# Patient Record
Sex: Male | Born: 1950 | Race: Black or African American | Hispanic: No | Marital: Married | Smoking: Never smoker
Health system: Southern US, Community
[De-identification: ages and names within clinical notes are randomized; demographics above are authoritative.]

## PROBLEM LIST (undated history)

## (undated) DIAGNOSIS — I6529 Occlusion and stenosis of unspecified carotid artery: Secondary | ICD-10-CM

## (undated) DIAGNOSIS — I10 Essential (primary) hypertension: Secondary | ICD-10-CM

## (undated) DIAGNOSIS — E785 Hyperlipidemia, unspecified: Secondary | ICD-10-CM

## (undated) HISTORY — DX: Essential (primary) hypertension: I10

## (undated) HISTORY — DX: Occlusion and stenosis of unspecified carotid artery: I65.29

## (undated) HISTORY — DX: Hyperlipidemia, unspecified: E78.5

---

## 2000-03-19 ENCOUNTER — Ambulatory Visit (HOSPITAL_COMMUNITY): Admission: RE | Admit: 2000-03-19 | Discharge: 2000-03-19 | Payer: Self-pay | Admitting: Cardiology

## 2001-05-21 ENCOUNTER — Encounter: Payer: Self-pay | Admitting: Cardiology

## 2001-05-21 ENCOUNTER — Inpatient Hospital Stay (HOSPITAL_COMMUNITY): Admission: EM | Admit: 2001-05-21 | Discharge: 2001-05-26 | Payer: Self-pay | Admitting: Emergency Medicine

## 2002-05-28 ENCOUNTER — Ambulatory Visit (HOSPITAL_COMMUNITY): Admission: RE | Admit: 2002-05-28 | Discharge: 2002-05-28 | Payer: Self-pay | Admitting: Cardiology

## 2004-07-04 ENCOUNTER — Ambulatory Visit (HOSPITAL_COMMUNITY): Admission: RE | Admit: 2004-07-04 | Discharge: 2004-07-04 | Payer: Self-pay | Admitting: Cardiology

## 2006-11-14 ENCOUNTER — Ambulatory Visit: Payer: Self-pay | Admitting: Vascular Surgery

## 2006-11-14 ENCOUNTER — Encounter: Payer: Self-pay | Admitting: Vascular Surgery

## 2006-11-14 ENCOUNTER — Ambulatory Visit (HOSPITAL_COMMUNITY): Admission: RE | Admit: 2006-11-14 | Discharge: 2006-11-14 | Payer: Self-pay | Admitting: Cardiology

## 2007-07-08 ENCOUNTER — Ambulatory Visit: Payer: Self-pay | Admitting: Vascular Surgery

## 2008-07-20 ENCOUNTER — Ambulatory Visit: Payer: Self-pay | Admitting: Vascular Surgery

## 2008-07-29 ENCOUNTER — Ambulatory Visit: Payer: Self-pay | Admitting: Vascular Surgery

## 2009-07-26 ENCOUNTER — Ambulatory Visit: Payer: Self-pay | Admitting: Vascular Surgery

## 2010-08-09 ENCOUNTER — Ambulatory Visit: Payer: Self-pay | Admitting: Vascular Surgery

## 2011-02-12 NOTE — Procedures (Signed)
CAROTID DUPLEX EXAM   INDICATION:  Follow up carotid disease.   HISTORY:  Diabetes:  No.  Cardiac:  No.  Hypertension:  Yes.  Smoking:  No.  Previous Surgery:  No.  CV History:  Currently asymptomatic.  Amaurosis Fugax No, Paresthesias No, Hemiparesis No.                                       RIGHT             LEFT  Brachial systolic pressure:         132               136  Brachial Doppler waveforms:         Normal            Normal  Vertebral direction of flow:        Antegrade         Antegrade  DUPLEX VELOCITIES (cm/sec)  CCA peak systolic                   100               105  ECA peak systolic                   193               222  ICA peak systolic                   143               145  ICA end diastolic                   32                37  PLAQUE MORPHOLOGY:                  Mixed             Mixed  PLAQUE AMOUNT:                      Moderate          Moderate  PLAQUE LOCATION:                    ICA/bifurcation   ICA/ECA   IMPRESSION:  1. Doppler velocities suggest 40% to 59% stenoses of the bilateral      proximal internal carotid arteries.  2. Elevated velocities noted in the bilateral external carotid      arteries.  3. No significant change noted when compared to the previous      examination on 07/26/2009.   ___________________________________________  Janetta Hora. Fields, MD   CH/MEDQ  D:  08/09/2010  T:  08/09/2010  Job:  [id number]

## 2011-02-12 NOTE — Assessment & Plan Note (Signed)
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  His last time seen by a physician at  this practice was 2006.  He has undergone yearly serial duplex Dopplers  of his carotid arteries.   Past medical history is significant for hypertension and dyslipidemia,  which are currently controlled by Dr. Shana Chute.   SOCIAL HISTORY:  He is married with 3 children.  He is a Psychologist, counselling.  He has no history of alcohol or tobacco use.   Review of systems was entirely negative.   PHYSICAL FINDINGS:  Height 5 feet 9.  Weight was 202 pounds.  Heart rate  was 75, blood pressure was 138/84, O2 sat was 98%.  On physical finding,  he was well-nourished in no distress.  HEENT:  PERRLA, EOMI with normal  conjunctiva.  Mucous membranes were pink and moist.  I did not  appreciate carotid bruits.  Lungs were clear to auscultation.  Cardiac  exam revealed a regular rate and rhythm.  The abdomen was soft,  nontender, nondistended.  Musculoskeletal exam demonstrated no major  deformities.  Neurological exam was nonfocal.   LABORATORY RESULTS:  He underwent bilateral duplex Dopplers.  He has a  stable 40% to 59% narrowing of his bilateral internal carotid arteries.  Elevated velocities were noted in the bilateral ECA's.  Vertebral flow  was antegrade.   ASSESSMENT/PLAN:  This 60 year old gentleman with carotid occlusive  disease remains stable at this time.  We will continue to follow him for  worsening of his carotid occlusive disease.  He will return to see [place] in  1 year with follow-up of bilateral carotid Dopplers.   Roger Arms, PA   Roger Hora. Fields, MD  Electronically Signed   KEL/MEDQ  D:  08/09/2010  T:   08/09/2010  Job:  [id number]

## 2011-02-12 NOTE — Procedures (Signed)
CAROTID DUPLEX EXAM   INDICATION:  Carotid disease.   HISTORY:  Diabetes:  No.  Cardiac:  No.  Hypertension:  Yes.  Smoking:  No.  Previous Surgery:  No.  CV History:  Asymptomatic.  Amaurosis Fugax No, Paresthesias No, Hemiparesis No.                                       RIGHT             LEFT  Brachial systolic pressure:         146               142  Brachial Doppler waveforms:         Normal            Normal  Vertebral direction of flow:        Antegrade         Antegrade  DUPLEX VELOCITIES (cm/sec)  CCA peak systolic                   115               105  ECA peak systolic                   185               245  ICA peak systolic                   124               136  ICA end diastolic                   30                33  PLAQUE MORPHOLOGY:                  Mixed             Mixed  PLAQUE AMOUNT:                      Mild              Mild  PLAQUE LOCATION:                    ICA/ECA           ICA/ECA   IMPRESSION:  1. Patient with 40- 59% stenosis of the bilateral internal carotid      arteries.  2. No significant change noted when compared to the previous exam on      July 20, 2009.    ___________________________________________  Janetta Hora. Fields, MD   CH/MEDQ  D:  07/26/2009  T:  07/26/2009  Job:  [id number]

## 2011-02-12 NOTE — Procedures (Signed)
CAROTID DUPLEX EXAM   INDICATION:  Followup of known carotid artery disease.  The patient is  asymptomatic.   HISTORY:  Diabetes:  No.  Cardiac:  No.  Hypertension:  Yes.  Smoking:  No.  Previous Surgery:  No.  CV History:  Amaurosis Fugax No, Paresthesias No, Hemiparesis No.                                       RIGHT             LEFT  Brachial systolic pressure:         164.              158.  Brachial Doppler waveforms:         Within normal limit.                Within normal limit.  Vertebral direction of flow:        Antegrade.        Antegrade.  DUPLEX VELOCITIES (cm/sec)  CCA peak systolic                   96.               92.  ECA peak systolic                   147.              253.  ICA peak systolic                   99.               175.  ICA end diastolic                   33.               53.  PLAQUE MORPHOLOGY:                  Heterogenous, calcified.            Heterogenous, calcified.  PLAQUE AMOUNT:                      Mild.             Moderate.  PLAQUE LOCATION:                    Bifurcation, ICA. Bifurcation, ICA.   IMPRESSION:  1. Essentially unchanged carotid duplex exam bilaterally.  2. 1-39% right internal carotid artery stenosis according to new      velocity criteria.  3. 40-59% left internal carotid artery stenosis according to new      velocity criteria.  4. Left external carotid artery stenosis.   ___________________________________________  Janetta Hora Fields, MD   PB/MEDQ  D:  07/08/2007  T:  07/09/2007  Job:  [id number]

## 2011-02-12 NOTE — Procedures (Signed)
CAROTID DUPLEX EXAM   INDICATION:  Follow up carotid artery disease.   HISTORY:  Diabetes:  No.  Cardiac:  No.  Hypertension:  Yes.  Smoking:  No.  Previous Surgery:  No.  CV History:  No.  Amaurosis Fugax No, Paresthesias No, Hemiparesis No.                                       RIGHT               LEFT  Brachial systolic pressure:         135                 130  Brachial Doppler waveforms:         WNL                 WNL  Vertebral direction of flow:        Antegrade           Antegrade  DUPLEX VELOCITIES (cm/sec)  CCA peak systolic                   79                  99  ECA peak systolic                   239                 351  ICA peak systolic                   128                 167  ICA end diastolic                   30                  51  PLAQUE MORPHOLOGY:                  Mixed               Mixed  PLAQUE AMOUNT:                      Mild                Moderate  PLAQUE LOCATION:                    ICA/ECA/bifurcation  ICA/ECA/bifurcation   IMPRESSION:  1. Bilateral internal carotid arteries show evidence of 40-59%      stenosis.  2. Bilateral external carotid artery stenosis.  3. No significant changes in velocities from previous study.  4. Incidental note:  Nodules noted in bilateral sides of the thyroid,      as previously documented, with right measuring 1.13 cm X 1.59 cm      and left measuring 0.69 cm X 0.98 cm.   ___________________________________________  Janetta Hora. Fields, MD   AS/MEDQ  D:  07/20/2008  T:  07/20/2008  Job:  [id number]

## 2011-02-15 NOTE — Discharge Summary (Signed)
[facility]  Patient:    Roger [name], Roger [name] Visit Number: [id number] MRN: [id number]          Service Type: MED Location: [phone] Attending Physician:  Sharyn Dross Dictated by:   Sharyn Dross., M.D. Adm. Date:  05/21/2001 Disc. Date: 05/26/2001                             Discharge Summary  ADMISSION DIAGNOSIS:  Lower gastrointestinal bleeding, etiology unknown.  DISCHARGE DIAGNOSIS:  Gastrointestinal bleeding, probably secondary to acid peptic disease.  CONDITION ON DISCHARGE:  Stable and improved.  DISCHARGE MEDICATIONS: 1. Niferex 150 mg q.d. 2. Protonix 40 mg q.d.  COMPLICATIONS:  None.  CONSULTATIONS:  None.  FOLLOWUP:  The patient is to follow up with me in one week for a re-evaluation in the office.  PROCEDURES: 1. On 05/22/01, esophagogastroduodenoscopy.  The results showed evidence of acute gastritis that is present. 2. On 05/23/01, colonoscopy.  Results were grossly normal examination.  HOSPITAL COURSE:  The patient was admitted into the hospital because of persistent lower gastrointestinal bleeding which started the morning of the admission.  The patient states that he passed initially bright blood, but became melanic in nature.  Based on the character of his bleeding, the patient was admitted into the hospital at this time.  The patient was started with IV therapy, and his history was very suggestive more for an upper GI bleed due to the character of melanotic stools, than a lower GI bleed that was noted.  He initially underwent an upper GI endoscopy to evaluate the area.  The results of the endoscopy showed evidence of acute gastritis with evidence of small remnants of blood that was present.  There was no active bleeding that was noted.  He was subsequently started on a proton pump inhibitor to help to control this.  However, it was unsure if evidence of blood from the lower GI tract was ongoing also.  Thus, the  patient underwent a colonoscopic examination.  The results of the colonoscopy were normal on the patient at this time.  The patients hospital course has been subsequently uneventful.  He dropped his initial hemoglobin from 12 down to 9, and then 8.8.  He was subsequently noted to have increased dizziness and weakness when the patient arrived to his bed.  He was transferred to the intensive care unit for close monitoring before the endoscopic procedures were performed.  After the results of the colonoscopic examination, the patients vital signs were relatively stable, and he was subsequently transferred out of the unit.  He had received 1 unit of packed red blood cells, and his repeat CBC was greater than 10, and thus the second unit was not given at that time.  The patient had no major symptoms of recurrence of bleeding, and in watching his hemoglobins over the past few days, it went from 12.7 down to 9.9, and then to 8.8, that was present when he received his transfusions, and subsequently had remained in the 8 range at this time.  It gradually dropped down to 8.7 and then subsequently to 8.4, but on today it was back to 8.8 at this time.  His Hemoccults were negative during the rest of the hospital course at this time with no major changes at this time.  There was no evidence of any orthostasis that was noted, and his blood pressure has been relatively stable at this time.  The patient will be discharged to home today, and he will be given Niferex to take on a daily basis for iron replacement, as well as Protonix 40 mg for the acute gastritis symptoms that he has.  He will be followed up in the office next week, and if stable, he will be able to return back to work without any complications at that point.  He may need to have a repeat endoscopic evaluation to determine that the healing process of the gastritis has improved, and a biopsy for Helicobacter should be done also to rule out  any possible evidence of infection or recurrent infections, bacterial response noted.  FINAL DIAGNOSIS:  As above.  CLINICAL CONDITION:  Stable and improved.  The patient was advised not to take aspirin products at least for the next month, and then if he resumes his aspirin a day, to use a baby aspirin coated at that time.Dictated by:   Sharyn Dross., M.D. Attending Physician:  Sharyn Dross DD:  05/26/01 TD:  05/26/01 Job: [id number]

## 2011-08-08 ENCOUNTER — Other Ambulatory Visit (INDEPENDENT_AMBULATORY_CARE_PROVIDER_SITE_OTHER): Payer: No Typology Code available for payment source | Admitting: *Deleted

## 2011-08-08 DIAGNOSIS — I6529 Occlusion and stenosis of unspecified carotid artery: Secondary | ICD-10-CM

## 2011-08-16 ENCOUNTER — Other Ambulatory Visit: Payer: Self-pay | Admitting: Vascular Surgery

## 2011-08-16 ENCOUNTER — Encounter: Payer: Self-pay | Admitting: Vascular Surgery

## 2011-08-16 DIAGNOSIS — I6529 Occlusion and stenosis of unspecified carotid artery: Secondary | ICD-10-CM

## 2011-08-16 NOTE — Procedures (Unsigned)
CAROTID DUPLEX EXAM  INDICATION:  Follow up carotid disease.  HISTORY: Diabetes:  No. Cardiac:  No. Hypertension:  Yes. Smoking:  No. Previous Surgery:  No. CV History:  Asymptomatic. Amaurosis Fugax No, Paresthesias No, Hemiparesis No.                                      RIGHT             LEFT Brachial systolic pressure:         133               135 Brachial Doppler waveforms:         Normal            Normal Vertebral direction of flow:        Antegrade         Antegrade DUPLEX VELOCITIES (cm/sec) CCA peak systolic                   131               92 ECA peak systolic                   187               294 ICA peak systolic                   135               75 ICA end diastolic                   40                23 PLAQUE MORPHOLOGY:                  Mixed             Mixed PLAQUE AMOUNT:                      Moderate          Moderate PLAQUE LOCATION:                    ICA, bifurcation  ICA, ECA  IMPRESSION: 1. Right internal carotid artery velocities suggest a 40% to 59%     stenosis. 2. Left internal carotid artery velocities suggest a 1% to 39%     stenosis.  Velocities may be underestimated due to calcific plaque     with acoustic shadowing. 3. Bilateral external carotid artery stenosis.  ___________________________________________ Janetta Hora Fields, MD  EM/MEDQ  D:  08/08/2011  T:  08/08/2011  Job:  [id number]

## 2011-10-16 ENCOUNTER — Ambulatory Visit (INDEPENDENT_AMBULATORY_CARE_PROVIDER_SITE_OTHER): Payer: No Typology Code available for payment source

## 2011-10-16 DIAGNOSIS — B9789 Other viral agents as the cause of diseases classified elsewhere: Secondary | ICD-10-CM

## 2012-05-05 DIAGNOSIS — L989 Disorder of the skin and subcutaneous tissue, unspecified: Secondary | ICD-10-CM | POA: Insufficient documentation

## 2012-07-31 ENCOUNTER — Encounter: Payer: Self-pay | Admitting: *Deleted

## 2012-08-12 ENCOUNTER — Encounter: Payer: Self-pay | Admitting: Neurosurgery

## 2012-08-13 ENCOUNTER — Other Ambulatory Visit (INDEPENDENT_AMBULATORY_CARE_PROVIDER_SITE_OTHER): Payer: No Typology Code available for payment source | Admitting: *Deleted

## 2012-08-13 ENCOUNTER — Ambulatory Visit (INDEPENDENT_AMBULATORY_CARE_PROVIDER_SITE_OTHER): Payer: No Typology Code available for payment source | Admitting: Neurosurgery

## 2012-08-13 ENCOUNTER — Encounter: Payer: Self-pay | Admitting: Neurosurgery

## 2012-08-13 VITALS — BP 133/74 | HR 74 | Resp 16 | Ht 69.0 in | Wt 204.0 lb

## 2012-08-13 DIAGNOSIS — I6529 Occlusion and stenosis of unspecified carotid artery: Secondary | ICD-10-CM

## 2012-08-13 NOTE — Progress Notes (Signed)
[facility] Carotid Office Note  CC: Carotid surveillance Referring Physician: Fields  History of Present Illness: 61 year old male patient of Dr. Darrick Penna without any history of carotid intervention. The patient denies any signs or symptoms of CVA, TIA, amaurosis fugax or any neural deficit. The patient denies any new medical diagnoses recent surgery.  Past Medical History  Diagnosis Date  . Carotid artery occlusion   . Hyperlipidemia   . Hypertension     ROS: [x]  Positive   [ ]  Denies    General: [ ]  Weight loss, [ ]  Fever, [ ]  chills Neurologic: [ ]  Dizziness, [ ]  Blackouts, [ ]  Seizure [ ]  Stroke, [ ]  "Mini stroke", [ ]  Slurred speech, [ ]  Temporary blindness; [ ]  weakness in arms or legs, [ ]  Hoarseness Cardiac: [ ]  Chest pain/pressure, [ ]  Shortness of breath at rest [ ]  Shortness of breath with exertion, [ ]  Atrial fibrillation or irregular heartbeat Vascular: [ ]  Pain in legs with walking, [ ]  Pain in legs at rest, [ ]  Pain in legs at night,  [ ]  Non-healing ulcer, [ ]  Blood clot in vein/DVT,   Pulmonary: [ ]  Home oxygen, [ ]  Productive cough, [ ]  Coughing up blood, [ ]  Asthma,  [ ]  Wheezing Musculoskeletal:  [ ]  Arthritis, [ ]  Low back pain, [ ]  Joint pain Hematologic: [ ]  Easy Bruising, [ ]  Anemia; [ ]  Hepatitis Gastrointestinal: [ ]  Blood in stool, [ ]  Gastroesophageal Reflux/heartburn, [ ]  Trouble swallowing Urinary: [ ]  chronic Kidney disease, [ ]  on HD - [ ]  MWF or [ ]  TTHS, [ ]  Burning with urination, [ ]  Difficulty urinating Skin: [ ]  Rashes, [ ]  Wounds Psychological: [ ]  Anxiety, [ ]  Depression   Social History History  Substance Use Topics  . Smoking status: Never Smoker   . Smokeless tobacco: Never Used  . Alcohol Use: No    Family History Family History  Problem Relation Age of Onset  . Stroke Mother   . Hypertension Mother   . Stroke Father   . Diabetes Sister   . Hypertension Sister   . Hypertension Daughter     No  Known Allergies  Current Outpatient Prescriptions  Medication Sig Dispense Refill  . amlodipine-atorvastatin (CADUET) 10-20 MG per tablet       . aspirin 81 MG tablet Take 81 mg by mouth daily.      . clopidogrel (PLAVIX) 75 MG tablet       . losartan (COZAAR) 100 MG tablet       . Multiple Vitamin (MULTIVITAMIN) tablet Take 1 tablet by mouth daily.      . vitamin E (VITAMIN E) 400 UNIT capsule Take 400 Units by mouth daily.        Physical Examination  Filed Vitals:   08/13/12 1435  BP: 133/74  Pulse: 74  Resp:     Body mass index is 30.13 kg/(m^2).  General:  WDWN in NAD Gait: Normal HEENT: WNL Eyes: Pupils equal Pulmonary: normal non-labored breathing , without Rales, rhonchi,  wheezing Cardiac: RRR, without  Murmurs, rubs or gallops; Abdomen: soft, NT, no masses Skin: no rashes, ulcers noted  Vascular Exam Pulses: 3+ radial pulses bilaterally Carotid bruits: Carotid pulses to auscultation no bruits are heard Extremities without ischemic changes, no Gangrene , no cellulitis; no open wounds;  Musculoskeletal: no muscle wasting or atrophy   Neurologic: A&O X 3; Appropriate Affect ; SENSATION: normal; MOTOR FUNCTION:  moving all extremities  equally. Speech is fluent/normal  Non-Invasive Vascular Imaging CAROTID DUPLEX 08/13/2012  Right ICA 20 - 39 % stenosis Left ICA 40 - 59 % stenosis   ASSESSMENT/PLAN: Asymptomatic patient with a slight increase in left ICA stenosis over the last year. The patient will followup in one year with repeat carotid duplex. The patient's questions were encouraged and answered, he is in agreement with this plan.  Lauree Chandler ANP   Clinic MD: Darrick Penna

## 2012-08-14 ENCOUNTER — Other Ambulatory Visit: Payer: Self-pay | Admitting: *Deleted

## 2012-08-14 NOTE — Addendum Note (Signed)
Addended by: Sharee Pimple on: 08/14/2012 08:47 AM   Modules accepted: Orders

## 2013-08-17 ENCOUNTER — Encounter: Payer: Self-pay | Admitting: Family

## 2013-08-18 ENCOUNTER — Ambulatory Visit: Payer: No Typology Code available for payment source | Admitting: Family

## 2013-08-18 ENCOUNTER — Inpatient Hospital Stay (HOSPITAL_COMMUNITY): Admission: RE | Admit: 2013-08-18 | Payer: No Typology Code available for payment source | Source: Ambulatory Visit

## 2013-08-19 ENCOUNTER — Ambulatory Visit: Payer: No Typology Code available for payment source | Admitting: Neurosurgery

## 2013-08-19 ENCOUNTER — Other Ambulatory Visit: Payer: No Typology Code available for payment source

## 2013-09-16 ENCOUNTER — Encounter: Payer: Self-pay | Admitting: Family

## 2013-09-17 ENCOUNTER — Ambulatory Visit (HOSPITAL_COMMUNITY)
Admission: RE | Admit: 2013-09-17 | Discharge: 2013-09-17 | Disposition: A | Discharge status: Home / Self Care | Payer: No Typology Code available for payment source | Source: Ambulatory Visit | Attending: Family | Admitting: Family

## 2013-09-17 ENCOUNTER — Encounter: Payer: Self-pay | Admitting: Family

## 2013-09-17 ENCOUNTER — Ambulatory Visit (INDEPENDENT_AMBULATORY_CARE_PROVIDER_SITE_OTHER): Payer: No Typology Code available for payment source | Admitting: Family

## 2013-09-17 DIAGNOSIS — R52 Pain, unspecified: Secondary | ICD-10-CM | POA: Insufficient documentation

## 2013-09-17 DIAGNOSIS — I6529 Occlusion and stenosis of unspecified carotid artery: Secondary | ICD-10-CM | POA: Insufficient documentation

## 2013-09-17 DIAGNOSIS — I658 Occlusion and stenosis of other precerebral arteries: Secondary | ICD-10-CM | POA: Insufficient documentation

## 2013-09-17 DIAGNOSIS — M79609 Pain in unspecified limb: Secondary | ICD-10-CM

## 2013-09-17 NOTE — Progress Notes (Signed)
Established Carotid Patient  Previous Carotid surgery: No  History of Present Illness  Roger [name] is a 62 y.o. male patient of Dr. Darrick Penna who has known carotid stenosis, returns today for follow up.  Patient has Negative history of TIA or stroke symptom.  The patient denies amaurosis fugax or monocular blindness.  The patient  denies facial drooping.  Pt. denies hemiplegia.  The patient denies receptive or expressive aphasia.  Pt. denies extremity weakness. Denies claudication symptoms, denies non-healing wounds.   Patient denies New Medical or Surgical History.  Pt Diabetic: No Pt smoker: non-smoker  Pt meds include: Statin : Yes ASA: Yes Other anticoagulants/antiplatelets: Plavix   Past Medical History  Diagnosis Date  . Carotid artery occlusion   . Hyperlipidemia   . Hypertension     Social History History  Substance Use Topics  . Smoking status: Never Smoker   . Smokeless tobacco: Never Used  . Alcohol Use: No    Family History Family History  Problem Relation Age of Onset  . Stroke Mother   . Hypertension Mother   . Stroke Father   . Diabetes Sister   . Hypertension Sister   . Hypertension Daughter     Surgical History History reviewed. No pertinent past surgical history.  No Known Allergies  Current Outpatient Prescriptions  Medication Sig Dispense Refill  . amlodipine-atorvastatin (CADUET) 10-20 MG per tablet       . aspirin 81 MG tablet Take 81 mg by mouth daily.      . clopidogrel (PLAVIX) 75 MG tablet       . losartan (COZAAR) 100 MG tablet       . Multiple Vitamin (MULTIVITAMIN) tablet Take 1 tablet by mouth daily.      . vitamin E (VITAMIN E) 400 UNIT capsule Take 400 Units by mouth daily.       No current facility-administered medications for this visit.    Review of Systems : See HPI for pertinent positives and negatives.  Physical Examination  Filed Vitals:   09/17/13 1159  BP: 137/77  Pulse: 65  Resp: 16   Filed Weights   09/17/13 1159  Weight: 198 lb (89.812 kg)   Body mass index is 29.23 kg/(m^2).   General: WDWN male in NAD GAIT: normal Eyes: PERRLA Pulmonary:  CTAB, Negative  Rales, Negative rhonchi, & Negative wheezing.  Cardiac: regular Rhythm ,  Negative Murmurs.  VASCULAR EXAM Carotid Bruits Left Right   Negative Negative    Aorta is not palpable. Radial pulses are 3+ palpable and equal.                                                                                                                            LE Pulses LEFT RIGHT       POPLITEAL  not palpable   not palpable       POSTERIOR TIBIAL   palpable    palpable  DORSALIS PEDIS      ANTERIOR TIBIAL  palpable   palpable     Gastrointestinal: soft, nontender, BS WNL, no r/g,  negative masses.  Musculoskeletal: Negative muscle atrophy/wasting. M/S 5/5 throughout, Extremities without ischemic changes.  Neurologic: A&O X 3; Appropriate Affect ; SENSATION ;normal;  Speech is normal CN 2-12 intact, Pain and light touch intact in extremities, Motor exam as listed above.   Non-Invasive Vascular Imaging CAROTID DUPLEX 09/17/2013   Right ICA: <40% stenosis. Left ICA: 40 - 59 % stenosis.  These findings are Unchanged from previous exam.  Assessment: Roger [name] is a 62 y.o. male who presents with asymptomatic minimal right ICA stenosis and mild/moderate left ICA stenosis. The  ICA stenosis is  Unchanged from previous exam.  Plan: Follow-up in 1 year with Carotid Duplex scan.   I discussed in depth with the patient the nature of atherosclerosis, and emphasized the importance of maximal medical management including strict control of blood pressure, blood glucose, and lipid levels, obtaining regular exercise, and continued cessation of smoking.  The patient is aware that without maximal medical management the underlying atherosclerotic disease process will progress, limiting the benefit of any interventions. The patient  was given information about stroke prevention and what symptoms should prompt the patient to seek immediate medical care. Thank you for allowing [place] to participate in this patient's care.  Charisse March, RN, MSN, FNP-C [facility] Office: [phone]  Clinic Physician: Imogene Burn  09/17/2013 12:17 PM

## 2013-09-17 NOTE — Addendum Note (Signed)
Addended by: Sharee Pimple on: 09/17/2013 01:49 PM   Modules accepted: Orders

## 2013-12-27 ENCOUNTER — Other Ambulatory Visit (HOSPITAL_COMMUNITY): Payer: Self-pay | Admitting: Cardiology

## 2013-12-27 DIAGNOSIS — I739 Peripheral vascular disease, unspecified: Secondary | ICD-10-CM

## 2013-12-27 DIAGNOSIS — I7090 Unspecified atherosclerosis: Secondary | ICD-10-CM

## 2014-01-04 ENCOUNTER — Encounter (HOSPITAL_COMMUNITY): Payer: No Typology Code available for payment source

## 2014-01-06 ENCOUNTER — Ambulatory Visit (HOSPITAL_COMMUNITY): Payer: No Typology Code available for payment source

## 2014-01-13 ENCOUNTER — Ambulatory Visit (HOSPITAL_COMMUNITY)
Admission: RE | Admit: 2014-01-13 | Discharge: 2014-01-13 | Disposition: A | Discharge status: Home / Self Care | Payer: No Typology Code available for payment source | Source: Ambulatory Visit | Attending: Cardiology | Admitting: Cardiology

## 2014-01-13 DIAGNOSIS — M79609 Pain in unspecified limb: Secondary | ICD-10-CM | POA: Insufficient documentation

## 2014-01-13 DIAGNOSIS — I7090 Unspecified atherosclerosis: Secondary | ICD-10-CM

## 2014-01-13 DIAGNOSIS — I739 Peripheral vascular disease, unspecified: Secondary | ICD-10-CM

## 2014-01-13 DIAGNOSIS — R52 Pain, unspecified: Secondary | ICD-10-CM

## 2014-01-13 NOTE — Progress Notes (Signed)
VASCULAR LAB PRELIMINARY  ARTERIAL  ABI completed:    RIGHT    LEFT    PRESSURE WAVEFORM  PRESSURE WAVEFORM  BRACHIAL 151 T BRACHIAL 146 T  DP 159 B DP 175 B  AT   AT    PT 175 B PT 170 B  PER   PER    GREAT TOE  NA GREAT TOE  NA    RIGHT LEFT  ABI >1.0 >1.0     Liona Wengert R Julena Barbour, RVT 01/13/2014, 1:24 PM

## 2014-05-01 ENCOUNTER — Ambulatory Visit (INDEPENDENT_AMBULATORY_CARE_PROVIDER_SITE_OTHER): Payer: 59 | Admitting: Physician Assistant

## 2014-05-01 VITALS — BP 138/76 | HR 75 | Temp 97.6°F | Resp 20 | Ht 68.25 in | Wt 201.2 lb

## 2014-05-01 DIAGNOSIS — T6391XA Toxic effect of contact with unspecified venomous animal, accidental (unintentional), initial encounter: Secondary | ICD-10-CM

## 2014-05-01 DIAGNOSIS — T63441A Toxic effect of venom of bees, accidental (unintentional), initial encounter: Secondary | ICD-10-CM

## 2014-05-01 DIAGNOSIS — T63461A Toxic effect of venom of wasps, accidental (unintentional), initial encounter: Secondary | ICD-10-CM

## 2014-05-01 MED ORDER — RANITIDINE HCL 300 MG PO TABS: 300.0000 mg | ORAL_TABLET | Freq: Every day | ORAL | Status: DC

## 2014-05-01 MED ORDER — CETIRIZINE HCL 10 MG PO TABS: 10.0000 mg | ORAL_TABLET | Freq: Every day | ORAL | Status: DC

## 2014-05-01 NOTE — Patient Instructions (Addendum)
Add benadryl - 25mg  every 6 hours while awake today until the swelling starts to go down - and then continue the other medication until the swelling is completely resolved  Continue to use ice to help with the swelling -

## 2014-05-01 NOTE — Progress Notes (Signed)
   Subjective:    Patient ID: Roger [name], male    DOB: 1951-09-21, 63 y.o.   MRN: [id number]  HPI Pt presents to clinic with left hand and wrist swelling after he got a bee sting from a possible yellow jacket yesterday evening.  He has used no medications but has been using ice to the hand to help with the swelling.  He is having no breathing problems.  Review of Systems  Constitutional: Negative for fever and chills.  Respiratory: Negative for shortness of breath.        Objective:   Physical Exam  Vitals reviewed. Constitutional: He is oriented to person, place, and time. He appears well-developed and well-nourished.  HENT:  Head: Normocephalic and atraumatic.  Right Ear: External ear normal.  Pulmonary/Chest: Effort normal.  Neurological: He is alert and oriented to person, place, and time.  Skin: Skin is warm and dry. No erythema.  Left dorsal hand swollen with sting area over the 4th MCP.  The swelling extends just proximal to the wrist.  Good capillary refill and good movement of fingers.  There is no erythema associated with the swelling.  Psychiatric: He has a normal mood and affect. His behavior is normal. Judgment and thought content normal.      Assessment & Plan:  Bee sting, accidental or unintentional, initial encounter - Plan: ranitidine (ZANTAC) 300 MG tablet, cetirizine (ZYRTEC) 10 MG tablet  Symptomatic care d/w with patient including ice to the area and elevation.    Benny LennertSarah Weber PA-C  [facility] 05/01/2014 11:52 AM

## 2014-06-26 ENCOUNTER — Other Ambulatory Visit: Payer: Self-pay | Admitting: Physician Assistant

## 2014-09-19 ENCOUNTER — Encounter: Payer: Self-pay | Admitting: Family

## 2014-09-20 ENCOUNTER — Ambulatory Visit (HOSPITAL_COMMUNITY)
Admission: RE | Admit: 2014-09-20 | Discharge: 2014-09-20 | Disposition: A | Discharge status: Home / Self Care | Payer: No Typology Code available for payment source | Source: Ambulatory Visit | Attending: Family | Admitting: Family

## 2014-09-20 ENCOUNTER — Encounter: Payer: Self-pay | Admitting: Family

## 2014-09-20 ENCOUNTER — Other Ambulatory Visit: Payer: Self-pay

## 2014-09-20 ENCOUNTER — Ambulatory Visit (INDEPENDENT_AMBULATORY_CARE_PROVIDER_SITE_OTHER): Payer: No Typology Code available for payment source | Admitting: Family

## 2014-09-20 VITALS — BP 136/77 | HR 66 | Resp 16 | Ht 69.0 in | Wt 199.0 lb

## 2014-09-20 DIAGNOSIS — I6523 Occlusion and stenosis of bilateral carotid arteries: Secondary | ICD-10-CM | POA: Insufficient documentation

## 2014-09-20 DIAGNOSIS — I6529 Occlusion and stenosis of unspecified carotid artery: Secondary | ICD-10-CM

## 2014-09-20 NOTE — Progress Notes (Signed)
Established Carotid Patient   History of Present Illness  Roger [name] is a 63 y.o. male patient of Dr. Darrick PennaFields who has known carotid stenosis, returns today for follow up.  Patient has Negative history of TIA or stroke symptom. The patient denies amaurosis fugax or monocular blindness. The patient denies facial drooping. Pt. denies hemiplegia. The patient denies receptive or expressive aphasia. Pt. denies extremity weakness. Denies claudication symptoms, denies non-healing wounds.  Patient reports New Medical or Surgical History: gets injections in his left eye for "a hole in my eye"  Pt Diabetic: No Pt smoker: non-smoker  Pt meds include: Statin : Yes ASA: Yes Other anticoagulants/antiplatelets: Plavix   Past Medical History  Diagnosis Date  . Carotid artery occlusion   . Hyperlipidemia   . Hypertension     Social History History  Substance Use Topics  . Smoking status: Never Smoker   . Smokeless tobacco: Never Used  . Alcohol Use: Yes     Comment: occ beer    Family History Family History  Problem Relation Age of Onset  . Stroke Mother   . Hypertension Mother   . Heart disease Mother     Before age 64  . Heart attack Mother   . Stroke Father   . Diabetes Sister   . Hypertension Sister   . Hyperlipidemia Sister   . Hypertension Daughter   . Hypertension Sister   . Diabetes Sister     Surgical History History reviewed. No pertinent past surgical history.  No Known Allergies  Current Outpatient Prescriptions  Medication Sig Dispense Refill  . amlodipine-atorvastatin (CADUET) 10-20 MG per tablet     . aspirin 81 MG tablet Take 81 mg by mouth daily.    . clopidogrel (PLAVIX) 75 MG tablet Take 75 mg by mouth once.     Marland Kitchen. losartan (COZAAR) 100 MG tablet Take 100 mg by mouth daily.     . Multiple Vitamin (MULTIVITAMIN) tablet Take 1 tablet by mouth daily.    . ranitidine (ZANTAC) 300 MG tablet Take 1 tablet (300 mg total) by mouth at bedtime. 30  tablet 0  . vitamin E (VITAMIN E) 400 UNIT capsule Take 400 Units by mouth daily.    . cetirizine (ZYRTEC) 10 MG tablet Take 1 tablet (10 mg total) by mouth daily. (Patient not taking: Reported on 09/20/2014) 30 tablet 0   No current facility-administered medications for this visit.    Review of Systems : See HPI for pertinent positives and negatives.  Physical Examination  Filed Vitals:   09/20/14 1158 09/20/14 1202  BP: 137/73 136/77  Pulse: 70 66  Resp:  16  Height:  5\' 9"  (1.753 m)  Weight:  199 lb (90.266 kg)  SpO2:  99%   Body mass index is 29.37 kg/(m^2).  General: WDWN male in NAD GAIT: normal Eyes: PERRLA Pulmonary: CTAB, Negative Rales, Negative rhonchi, & Negative wheezing.  Cardiac: regular Rhythm, no detected murmur  VASCULAR EXAM Carotid Bruits Left Right   Negative Negative   Aorta is not palpable. Radial pulses are 2+ palpable and equal.      LE Pulses LEFT RIGHT   POPLITEAL not palpable  not palpable   POSTERIOR TIBIAL  palpable   palpable    DORSALIS PEDIS  ANTERIOR TIBIAL palpable  palpable     Gastrointestinal: soft, nontender, BS WNL, no r/g, no palpable masses.  Musculoskeletal: Negative muscle atrophy/wasting. M/S 5/5 throughout, Extremities without ischemic changes.  Neurologic: A&O X 3; Appropriate Affect, Speech  is normal CN 2-12 intact, Pain and light touch intact in extremities, Motor exam as listed above.    Non-Invasive Vascular Imaging CAROTID DUPLEX 09/20/2014 CEREBROVASCULAR DUPLEX EVALUATION    INDICATION: Carotid stenosis    PREVIOUS INTERVENTION(S):     DUPLEX EXAM:     RIGHT  LEFT  Peak Systolic Velocities (cm/s) End Diastolic Velocities (cm/s) Plaque LOCATION Peak Systolic Velocities (cm/s) End Diastolic Velocities (cm/s) Plaque  102 14   CCA PROXIMAL 132 31   108 22  CCA MID 105 23   159 36 HT CCA DISTAL 127 30 HT  200 28 HT ECA 299 39 HT  120 33 CP ICA PROXIMAL 176 38 CP  116 31  ICA MID 55 23   120 37  ICA DISTAL 90 35     1.11 ICA / CCA Ratio (PSV) 1.68  Antegrade Vertebral Flow Antegrade  144 Brachial Systolic Pressure (mmHg) 148  Triphasic Brachial Artery Waveforms Triphasic    Plaque Morphology:  HM = Homogeneous, HT = Heterogeneous, CP = Calcific Plaque, SP = Smooth Plaque, IP = Irregular Plaque     ADDITIONAL FINDINGS:     IMPRESSION: Bilateral internal carotid artery stenosis present in the less than 40% range, dense calcific plaque present making Doppler interrogation difficult and maybe underestimating disease. Bilateral external carotid artery stenosis present.    Compared to the previous exam:  Stable on the right and classification downgraded on the left since previous study 09/17/2013,however most likely due to difficult interrogation of vessel.      Assessment: Roger RockerCharles O Simonin is a 63 y.o. male who presents with asymptomatic minimal bilateral internal carotid artery stenoses. Stable on the right and classification downgraded on the left since previous study 09/17/2013,however most likely due to difficult interrogation of vessel.  Plan: Follow-up in 1 year with Carotid Duplex.   I discussed in depth with the patient the nature of atherosclerosis, and emphasized the importance of maximal medical management including strict control of blood pressure, blood glucose, and lipid levels, obtaining regular exercise, and continued cessation of smoking.  The patient is aware that without maximal medical management the underlying atherosclerotic disease process will progress, limiting the benefit of any interventions. The patient was given information about stroke prevention and what symptoms should prompt the patient to seek immediate medical care. Thank you for allowing us to participate in this patient's  care.  Charisse MarchSuzanne Chaya Dehaan, RN, MSN, FNP-C [facility] [place] Office: [phone]  Clinic Physician: Early  09/20/2014 12:10 PM

## 2014-09-20 NOTE — Patient Instructions (Signed)
Stroke Prevention Some medical conditions and behaviors are associated with an increased chance of having a stroke. You may prevent a stroke by making healthy choices and managing medical conditions. HOW CAN I REDUCE MY RISK OF HAVING A STROKE?   Stay physically active. Get at least 30 minutes of activity on most or all days.  Do not smoke. It may also be helpful to avoid exposure to secondhand smoke.  Limit alcohol use. Moderate alcohol use is considered to be:  No more than 2 drinks per day for men.  No more than 1 drink per day for nonpregnant women.  Eat healthy foods. This involves:  Eating 5 or more servings of fruits and vegetables a day.  Making dietary changes that address high blood pressure (hypertension), high cholesterol, diabetes, or obesity.  Manage your cholesterol levels.  Making food choices that are high in fiber and low in saturated fat, trans fat, and cholesterol may control cholesterol levels.  Take any prescribed medicines to control cholesterol as directed by your health care provider.  Manage your diabetes.  Controlling your carbohydrate and sugar intake is recommended to manage diabetes.  Take any prescribed medicines to control diabetes as directed by your health care provider.  Control your hypertension.  Making food choices that are low in salt (sodium), saturated fat, trans fat, and cholesterol is recommended to manage hypertension.  Take any prescribed medicines to control hypertension as directed by your health care provider.  Maintain a healthy weight.  Reducing calorie intake and making food choices that are low in sodium, saturated fat, trans fat, and cholesterol are recommended to manage weight.  Stop drug abuse.  Avoid taking birth control pills.  Talk to your health care provider about the risks of taking birth control pills if you are over 35 years old, smoke, get migraines, or have ever had a blood clot.  Get evaluated for sleep  disorders (sleep apnea).  Talk to your health care provider about getting a sleep evaluation if you snore a lot or have excessive sleepiness.  Take medicines only as directed by your health care provider.  For some people, aspirin or blood thinners (anticoagulants) are helpful in reducing the risk of forming abnormal blood clots that can lead to stroke. If you have the irregular heart rhythm of atrial fibrillation, you should be on a blood thinner unless there is a good reason you cannot take them.  Understand all your medicine instructions.  Make sure that other conditions (such as anemia or atherosclerosis) are addressed. SEEK IMMEDIATE MEDICAL CARE IF:   You have sudden weakness or numbness of the face, arm, or leg, especially on one side of the body.  Your face or eyelid droops to one side.  You have sudden confusion.  You have trouble speaking (aphasia) or understanding.  You have sudden trouble seeing in one or both eyes.  You have sudden trouble walking.  You have dizziness.  You have a loss of balance or coordination.  You have a sudden, severe headache with no known cause.  You have new chest pain or an irregular heartbeat. Any of these symptoms may represent a serious problem that is an emergency. Do not wait to see if the symptoms will go away. Get medical help at once. Call your local emergency services (911 in [place] Do not drive yourself to the hospital. Document Released: 10/24/2004 Document Revised: 01/31/2014 Document Reviewed: 03/19/2013 [employer] Patient Information 2015 [employer]. This information is not intended to replace advice given   to you by your health care provider. Make sure you discuss any questions you have with your health care provider.  

## 2015-09-20 ENCOUNTER — Encounter: Payer: Self-pay | Admitting: Family

## 2015-09-27 ENCOUNTER — Other Ambulatory Visit: Payer: Self-pay | Admitting: Family

## 2015-09-27 DIAGNOSIS — I6523 Occlusion and stenosis of bilateral carotid arteries: Secondary | ICD-10-CM

## 2015-09-28 ENCOUNTER — Encounter: Payer: Self-pay | Admitting: Family

## 2015-09-28 ENCOUNTER — Ambulatory Visit (HOSPITAL_COMMUNITY)
Admission: RE | Admit: 2015-09-28 | Discharge: 2015-09-28 | Disposition: A | Discharge status: Home / Self Care | Payer: No Typology Code available for payment source | Source: Ambulatory Visit | Attending: Family | Admitting: Family

## 2015-09-28 ENCOUNTER — Ambulatory Visit (INDEPENDENT_AMBULATORY_CARE_PROVIDER_SITE_OTHER): Payer: No Typology Code available for payment source | Admitting: Family

## 2015-09-28 VITALS — BP 134/73 | HR 71 | Temp 99.0°F | Resp 16 | Ht 69.0 in | Wt 202.0 lb

## 2015-09-28 DIAGNOSIS — I6523 Occlusion and stenosis of bilateral carotid arteries: Secondary | ICD-10-CM | POA: Insufficient documentation

## 2015-09-28 DIAGNOSIS — I10 Essential (primary) hypertension: Secondary | ICD-10-CM | POA: Insufficient documentation

## 2015-09-28 DIAGNOSIS — E785 Hyperlipidemia, unspecified: Secondary | ICD-10-CM | POA: Insufficient documentation

## 2015-09-28 NOTE — Patient Instructions (Signed)
Stroke Prevention Some medical conditions and behaviors are associated with an increased chance of having a stroke. You may prevent a stroke by making healthy choices and managing medical conditions. HOW CAN I REDUCE MY RISK OF HAVING A STROKE?   Stay physically active. Get at least 30 minutes of activity on most or all days.  Do not smoke. It may also be helpful to avoid exposure to secondhand smoke.  Limit alcohol use. Moderate alcohol use is considered to be:  No more than 2 drinks per day for men.  No more than 1 drink per day for nonpregnant women.  Eat healthy foods. This involves:  Eating 5 or more servings of fruits and vegetables a day.  Making dietary changes that address high blood pressure (hypertension), high cholesterol, diabetes, or obesity.  Manage your cholesterol levels.  Making food choices that are high in fiber and low in saturated fat, trans fat, and cholesterol may control cholesterol levels.  Take any prescribed medicines to control cholesterol as directed by your health care provider.  Manage your diabetes.  Controlling your carbohydrate and sugar intake is recommended to manage diabetes.  Take any prescribed medicines to control diabetes as directed by your health care provider.  Control your hypertension.  Making food choices that are low in salt (sodium), saturated fat, trans fat, and cholesterol is recommended to manage hypertension.  Ask your health care provider if you need treatment to lower your blood pressure. Take any prescribed medicines to control hypertension as directed by your health care provider.  If you are 18-39 years of age, have your blood pressure checked every 3-5 years. If you are 40 years of age or older, have your blood pressure checked every year.  Maintain a healthy weight.  Reducing calorie intake and making food choices that are low in sodium, saturated fat, trans fat, and cholesterol are recommended to manage  weight.  Stop drug abuse.  Avoid taking birth control pills.  Talk to your health care provider about the risks of taking birth control pills if you are over 35 years old, smoke, get migraines, or have ever had a blood clot.  Get evaluated for sleep disorders (sleep apnea).  Talk to your health care provider about getting a sleep evaluation if you snore a lot or have excessive sleepiness.  Take medicines only as directed by your health care provider.  For some people, aspirin or blood thinners (anticoagulants) are helpful in reducing the risk of forming abnormal blood clots that can lead to stroke. If you have the irregular heart rhythm of atrial fibrillation, you should be on a blood thinner unless there is a good reason you cannot take them.  Understand all your medicine instructions.  Make sure that other conditions (such as anemia or atherosclerosis) are addressed. SEEK IMMEDIATE MEDICAL CARE IF:   You have sudden weakness or numbness of the face, arm, or leg, especially on one side of the body.  Your face or eyelid droops to one side.  You have sudden confusion.  You have trouble speaking (aphasia) or understanding.  You have sudden trouble seeing in one or both eyes.  You have sudden trouble walking.  You have dizziness.  You have a loss of balance or coordination.  You have a sudden, severe headache with no known cause.  You have new chest pain or an irregular heartbeat. Any of these symptoms may represent a serious problem that is an emergency. Do not wait to see if the symptoms will   go away. Get medical help at once. Call your local emergency services (911 in [place] Do not drive yourself to the hospital.   This information is not intended to replace advice given to you by your health care provider. Make sure you discuss any questions you have with your health care provider.   Document Released: 10/24/2004 Document Revised: 10/07/2014 Document Reviewed:  03/19/2013 Elsevier Interactive Patient Education 2016 Elsevier Inc.  

## 2015-09-28 NOTE — Progress Notes (Signed)
Chief Complaint: Extracranial Carotid Artery Stenosis   History of Present Illness  FABIANO GINLEY is a 64 y.o. male  patient of Dr. Darrick Penna who has known carotid artery stenosis, returns today for follow up.  The patient denies any hx of TIA or stroke symptoms.Specifically he denies a hx of amaurosis fugax or monocular blindness, unilateral facial drooping, hemiplegia, or receptive or expressive aphasia.  He denies claudication symptoms with walking, denies non-healing wounds.  Patient reports New Medical or Surgical History: none  Pt Diabetic: No Pt smoker: non-smoker  Pt meds include: Statin : Yes ASA: Yes Other anticoagulants/antiplatelets: Plavix    Past Medical History  Diagnosis Date  . Carotid artery occlusion   . Hyperlipidemia   . Hypertension     Social History Social History  Substance Use Topics  . Smoking status: Never Smoker   . Smokeless tobacco: Never Used  . Alcohol Use: Yes     Comment: occ beer    Family History Family History  Problem Relation Age of Onset  . Stroke Mother   . Hypertension Mother   . Heart disease Mother     Before age 37  . Heart attack Mother   . Stroke Father   . Diabetes Sister   . Hypertension Sister   . Hyperlipidemia Sister   . Hypertension Daughter   . Hypertension Sister   . Diabetes Sister     Surgical History No past surgical history on file.  No Known Allergies  Current Outpatient Prescriptions  Medication Sig Dispense Refill  . amlodipine-atorvastatin (CADUET) 10-20 MG per tablet     . aspirin 81 MG tablet Take 81 mg by mouth daily.    . cetirizine (ZYRTEC) 10 MG tablet Take 1 tablet (10 mg total) by mouth daily. (Patient not taking: Reported on 09/20/2014) 30 tablet 0  . clopidogrel (PLAVIX) 75 MG tablet Take 75 mg by mouth once.     Marland Kitchen losartan (COZAAR) 100 MG tablet Take 100 mg by mouth daily.     . Multiple Vitamin (MULTIVITAMIN) tablet Take 1 tablet by mouth daily.    . ranitidine (ZANTAC)  300 MG tablet Take 1 tablet (300 mg total) by mouth at bedtime. 30 tablet 0  . vitamin E (VITAMIN E) 400 UNIT capsule Take 400 Units by mouth daily.     No current facility-administered medications for this visit.    Review of Systems : See HPI for pertinent positives and negatives.  Physical Examination  Filed Vitals:   09/28/15 1036 09/28/15 1039  BP: 133/76 134/73  Pulse: 70 71  Temp:  99 F (37.2 C)  TempSrc:  Oral  Resp:  16  Height:   (1.753 m)  Weight:  202 lb (91.627 kg)  SpO2:  98%   Body mass index is 29.82 kg/(m^2).   General: WDWN male in NAD GAIT: normal Eyes: PERRLA Pulmonary: CTAB, no rales,  rhonchi, or wheezing.  Cardiac: regular rhythm, no detected murmur  VASCULAR EXAM Carotid Bruits Left Right   Negative Negative   Aorta is not palpable. Radial pulses are 2+ palpable and equal.      LE Pulses LEFT RIGHT   POPLITEAL not palpable  not palpable   POSTERIOR TIBIAL  palpable   palpable    DORSALIS PEDIS  ANTERIOR TIBIAL palpable  palpable     Gastrointestinal: soft, nontender, BS WNL, no r/g, no palpable masses.  Musculoskeletal: Negative muscle atrophy/wasting. M/S 5/5 throughout, Extremities without ischemic changes.  Neurologic: A&O X  3; Appropriate Affect, Speech is normal CN 2-12 intact, Pain and light touch intact in extremities, Motor exam as listed above.          Non-Invasive Vascular Imaging CAROTID DUPLEX 09/28/2015   CEREBROVASCULAR DUPLEX EVALUATION    INDICATION: Carotid artery stenosis    PREVIOUS INTERVENTION(S): NA    DUPLEX EXAM:     RIGHT  LEFT  Peak Systolic Velocities (cm/s) End Diastolic Velocities (cm/s) Plaque LOCATION Peak Systolic Velocities (cm/s) End Diastolic Velocities (cm/s) Plaque  124 24  CCA  PROXIMAL 126 27   141 29  CCA MID 115 22   161 37 HT CCA DISTAL 119 28 HT  270 35 HT ECA 335 51 HT  184 47 CP ICA PROXIMAL 187 36 HT  89 24  ICA MID 101 31   86 28  ICA DISTAL 80 21     1.30 ICA / CCA Ratio (PSV) 1.62  Antegrade Vertebral Flow Antegrade  140 Brachial Systolic Pressure (mmHg) 140  Triphasic Brachial Artery Waveforms Triphasic    Plaque Morphology:  HM = Homogeneous, HT = Heterogeneous, CP = Calcific Plaque, SP = Smooth Plaque, IP = Irregular Plaque     ADDITIONAL FINDINGS: Right subclavian artery PSV233cm/sec; Left subclavian artery PSV83cm/sec    IMPRESSION: Right internal carotid artery stenosis present in the 40%-59% range. Left internal carotid artery stenosis present of less than 40%. Bilateral external carotid artery stenosis present.    Compared to the previous exam:  Essentially unchanged since previous study on 09/20/2014.      Assessment: Lahoma RockerCharles O Petrasek is a 64 y.o. male who has no hx of stroke or TIA. Today's carotid duplex suggests  40%-59% right ICA stenosis and  less than 40% left ICA stenosis. Bilateral external carotid artery stenosis present. Essentially unchanged since previous study on 09/20/2014.   Fortunately he does not have DM and has never used tobacco. His wife smokes but not in their house. He denies any known hx of CAD. He takes a statin, ASA, and Plavix.    Plan: Follow-up in 1 year with Carotid Duplex scan.   I discussed in depth with the patient the nature of atherosclerosis, and emphasized the importance of maximal medical management including strict control of blood pressure, blood glucose, and lipid levels, obtaining regular exercise, and continued cessation of smoking.  The patient is aware that without maximal medical management the underlying atherosclerotic disease process will progress, limiting the benefit of any interventions. The patient was given information about stroke prevention and what symptoms should prompt the  patient to seek immediate medical care. Thank you for allowing us to participate in this patient's care.  Charisse MarchSuzanne Chase Arnall, RN, MSN, FNP-C [facility] [place] Office: [phone]  Clinic Physician: Myra GianottiBrabham on call  09/28/2015 10:35 AM

## 2015-09-28 NOTE — Addendum Note (Signed)
Addended by: Adria DillELDRIDGE-LEWIS, Zyara Riling L on: 09/28/2015 01:20 PM   Modules accepted: Orders

## 2016-01-26 ENCOUNTER — Encounter (INDEPENDENT_AMBULATORY_CARE_PROVIDER_SITE_OTHER): Payer: 59 | Admitting: Ophthalmology

## 2016-02-15 ENCOUNTER — Encounter (INDEPENDENT_AMBULATORY_CARE_PROVIDER_SITE_OTHER): Payer: 59 | Admitting: Ophthalmology

## 2016-02-15 DIAGNOSIS — I10 Essential (primary) hypertension: Secondary | ICD-10-CM | POA: Diagnosis not present

## 2016-02-15 DIAGNOSIS — H35033 Hypertensive retinopathy, bilateral: Secondary | ICD-10-CM

## 2016-02-15 DIAGNOSIS — H353221 Exudative age-related macular degeneration, left eye, with active choroidal neovascularization: Secondary | ICD-10-CM

## 2016-02-15 DIAGNOSIS — H43813 Vitreous degeneration, bilateral: Secondary | ICD-10-CM | POA: Diagnosis not present

## 2016-03-14 ENCOUNTER — Encounter (INDEPENDENT_AMBULATORY_CARE_PROVIDER_SITE_OTHER): Payer: 59 | Admitting: Ophthalmology

## 2016-03-14 DIAGNOSIS — H353221 Exudative age-related macular degeneration, left eye, with active choroidal neovascularization: Secondary | ICD-10-CM | POA: Diagnosis not present

## 2016-03-14 DIAGNOSIS — H35033 Hypertensive retinopathy, bilateral: Secondary | ICD-10-CM

## 2016-03-14 DIAGNOSIS — I10 Essential (primary) hypertension: Secondary | ICD-10-CM | POA: Diagnosis not present

## 2016-03-14 DIAGNOSIS — H43813 Vitreous degeneration, bilateral: Secondary | ICD-10-CM | POA: Diagnosis not present

## 2016-04-11 ENCOUNTER — Encounter (INDEPENDENT_AMBULATORY_CARE_PROVIDER_SITE_OTHER): Payer: 59 | Admitting: Ophthalmology

## 2016-04-11 DIAGNOSIS — H43813 Vitreous degeneration, bilateral: Secondary | ICD-10-CM

## 2016-04-11 DIAGNOSIS — H35033 Hypertensive retinopathy, bilateral: Secondary | ICD-10-CM

## 2016-04-11 DIAGNOSIS — I10 Essential (primary) hypertension: Secondary | ICD-10-CM

## 2016-04-11 DIAGNOSIS — H353221 Exudative age-related macular degeneration, left eye, with active choroidal neovascularization: Secondary | ICD-10-CM | POA: Diagnosis not present

## 2016-05-09 ENCOUNTER — Encounter (INDEPENDENT_AMBULATORY_CARE_PROVIDER_SITE_OTHER): Payer: 59 | Admitting: Ophthalmology

## 2016-05-09 DIAGNOSIS — H353221 Exudative age-related macular degeneration, left eye, with active choroidal neovascularization: Secondary | ICD-10-CM | POA: Diagnosis not present

## 2016-05-09 DIAGNOSIS — H43813 Vitreous degeneration, bilateral: Secondary | ICD-10-CM | POA: Diagnosis not present

## 2016-05-09 DIAGNOSIS — H35033 Hypertensive retinopathy, bilateral: Secondary | ICD-10-CM

## 2016-05-09 DIAGNOSIS — I10 Essential (primary) hypertension: Secondary | ICD-10-CM | POA: Diagnosis not present

## 2016-06-06 ENCOUNTER — Encounter (INDEPENDENT_AMBULATORY_CARE_PROVIDER_SITE_OTHER): Payer: 59 | Admitting: Ophthalmology

## 2016-06-06 DIAGNOSIS — H353221 Exudative age-related macular degeneration, left eye, with active choroidal neovascularization: Secondary | ICD-10-CM | POA: Diagnosis not present

## 2016-06-06 DIAGNOSIS — H2513 Age-related nuclear cataract, bilateral: Secondary | ICD-10-CM

## 2016-06-06 DIAGNOSIS — I10 Essential (primary) hypertension: Secondary | ICD-10-CM

## 2016-06-06 DIAGNOSIS — H43813 Vitreous degeneration, bilateral: Secondary | ICD-10-CM | POA: Diagnosis not present

## 2016-06-06 DIAGNOSIS — H35033 Hypertensive retinopathy, bilateral: Secondary | ICD-10-CM | POA: Diagnosis not present

## 2016-07-18 ENCOUNTER — Encounter (INDEPENDENT_AMBULATORY_CARE_PROVIDER_SITE_OTHER): Payer: 59 | Admitting: Ophthalmology

## 2016-07-18 DIAGNOSIS — I10 Essential (primary) hypertension: Secondary | ICD-10-CM

## 2016-07-18 DIAGNOSIS — H43813 Vitreous degeneration, bilateral: Secondary | ICD-10-CM

## 2016-07-18 DIAGNOSIS — H35033 Hypertensive retinopathy, bilateral: Secondary | ICD-10-CM

## 2016-07-18 DIAGNOSIS — H353111 Nonexudative age-related macular degeneration, right eye, early dry stage: Secondary | ICD-10-CM | POA: Diagnosis not present

## 2016-07-18 DIAGNOSIS — H353221 Exudative age-related macular degeneration, left eye, with active choroidal neovascularization: Secondary | ICD-10-CM | POA: Diagnosis not present

## 2016-08-29 ENCOUNTER — Encounter (INDEPENDENT_AMBULATORY_CARE_PROVIDER_SITE_OTHER): Payer: 59 | Admitting: Ophthalmology

## 2016-08-29 DIAGNOSIS — H353221 Exudative age-related macular degeneration, left eye, with active choroidal neovascularization: Secondary | ICD-10-CM | POA: Diagnosis not present

## 2016-08-29 DIAGNOSIS — H2513 Age-related nuclear cataract, bilateral: Secondary | ICD-10-CM

## 2016-08-29 DIAGNOSIS — I10 Essential (primary) hypertension: Secondary | ICD-10-CM

## 2016-08-29 DIAGNOSIS — H43813 Vitreous degeneration, bilateral: Secondary | ICD-10-CM | POA: Diagnosis not present

## 2016-08-29 DIAGNOSIS — H35033 Hypertensive retinopathy, bilateral: Secondary | ICD-10-CM

## 2016-09-20 ENCOUNTER — Encounter: Payer: Self-pay | Admitting: Family

## 2016-10-03 ENCOUNTER — Encounter: Payer: Self-pay | Admitting: Family

## 2016-10-03 ENCOUNTER — Ambulatory Visit (INDEPENDENT_AMBULATORY_CARE_PROVIDER_SITE_OTHER): Payer: Managed Care, Other (non HMO) | Admitting: Family

## 2016-10-03 ENCOUNTER — Ambulatory Visit (HOSPITAL_COMMUNITY)
Admission: RE | Admit: 2016-10-03 | Discharge: 2016-10-03 | Disposition: A | Discharge status: Home / Self Care | Payer: Managed Care, Other (non HMO) | Source: Ambulatory Visit | Attending: Family | Admitting: Family

## 2016-10-03 VITALS — BP 139/74 | HR 77 | Resp 16 | Ht 69.0 in | Wt 200.3 lb

## 2016-10-03 DIAGNOSIS — I6523 Occlusion and stenosis of bilateral carotid arteries: Secondary | ICD-10-CM

## 2016-10-03 LAB — VAS US CAROTID
LCCADDIAS: -27 cm/s
LCCADSYS: -142 cm/s
LEFT ECA DIAS: -22 cm/s
LEFT VERTEBRAL DIAS: 15 cm/s
LICADSYS: -86 cm/s
Left CCA prox dias: 24 cm/s
Left CCA prox sys: 233 cm/s
Left ICA dist dias: -16 cm/s
Left ICA prox dias: 41 cm/s
Left ICA prox sys: 193 cm/s
RCCAPDIAS: 17 cm/s
RIGHT CCA MID DIAS: 25 cm/s
RIGHT ECA DIAS: -25 cm/s
RIGHT VERTEBRAL DIAS: 13 cm/s
Right CCA prox sys: 138 cm/s
Right cca dist sys: -101 cm/s

## 2016-10-03 NOTE — Patient Instructions (Signed)
Stroke Prevention Some medical conditions and behaviors are associated with an increased chance of having a stroke. You may prevent a stroke by making healthy choices and managing medical conditions. How can I reduce my risk of having a stroke?  Stay physically active. Get at least 30 minutes of activity on most or all days.  Do not smoke. It may also be helpful to avoid exposure to secondhand smoke.  Limit alcohol use. Moderate alcohol use is considered to be:  No more than 2 drinks per day for men.  No more than 1 drink per day for nonpregnant women.  Eat healthy foods. This involves:  Eating 5 or more servings of fruits and vegetables a day.  Making dietary changes that address high blood pressure (hypertension), high cholesterol, diabetes, or obesity.  Manage your cholesterol levels.  Making food choices that are high in fiber and low in saturated fat, trans fat, and cholesterol may control cholesterol levels.  Take any prescribed medicines to control cholesterol as directed by your health care provider.  Manage your diabetes.  Controlling your carbohydrate and sugar intake is recommended to manage diabetes.  Take any prescribed medicines to control diabetes as directed by your health care provider.  Control your hypertension.  Making food choices that are low in salt (sodium), saturated fat, trans fat, and cholesterol is recommended to manage hypertension.  Ask your health care provider if you need treatment to lower your blood pressure. Take any prescribed medicines to control hypertension as directed by your health care provider.  If you are 18-39 years of age, have your blood pressure checked every 3-5 years. If you are 40 years of age or older, have your blood pressure checked every year.  Maintain a healthy weight.  Reducing calorie intake and making food choices that are low in sodium, saturated fat, trans fat, and cholesterol are recommended to manage  weight.  Stop drug abuse.  Avoid taking birth control pills.  Talk to your health care provider about the risks of taking birth control pills if you are over 35 years old, smoke, get migraines, or have ever had a blood clot.  Get evaluated for sleep disorders (sleep apnea).  Talk to your health care provider about getting a sleep evaluation if you snore a lot or have excessive sleepiness.  Take medicines only as directed by your health care provider.  For some people, aspirin or blood thinners (anticoagulants) are helpful in reducing the risk of forming abnormal blood clots that can lead to stroke. If you have the irregular heart rhythm of atrial fibrillation, you should be on a blood thinner unless there is a good reason you cannot take them.  Understand all your medicine instructions.  Make sure that other conditions (such as anemia or atherosclerosis) are addressed. Get help right away if:  You have sudden weakness or numbness of the face, arm, or leg, especially on one side of the body.  Your face or eyelid droops to one side.  You have sudden confusion.  You have trouble speaking (aphasia) or understanding.  You have sudden trouble seeing in one or both eyes.  You have sudden trouble walking.  You have dizziness.  You have a loss of balance or coordination.  You have a sudden, severe headache with no known cause.  You have new chest pain or an irregular heartbeat. Any of these symptoms may represent a serious problem that is an emergency. Do not wait to see if the symptoms will go away.   Get medical help at once. Call your local emergency services (911 in [place] Do not drive yourself to the hospital. This information is not intended to replace advice given to you by your health care provider. Make sure you discuss any questions you have with your health care provider. Document Released: 10/24/2004 Document Revised: 02/22/2016 Document Reviewed: 03/19/2013 Elsevier  Interactive Patient Education  2017 Elsevier Inc.  

## 2016-10-03 NOTE — Progress Notes (Signed)
[facility] [place] HISTORY AND PHYSICAL   MRN : [id number]  History of Present Illness:   Roger [name] is a 66 y.o. male patient of Dr. Darrick PennaFields who has known carotid artery stenosis, returns today for follow up.  The patient denies any hx of TIA or stroke symptoms.Specifically he denies a hx of amaurosis fugax or monocular blindness, unilateral facial drooping, hemiplegia, or receptive or expressive aphasia.  He denies claudication symptoms with walking, denies non-healing wounds.  Patient reports New Medical or Surgical History: none  Pt Diabetic: No Pt smoker: non-smoker  Pt meds include: Statin : Yes ASA: Yes Other anticoagulants/antiplatelets: Plavix    Current Outpatient Prescriptions  Medication Sig Dispense Refill  . amlodipine-atorvastatin (CADUET) 10-20 MG per tablet     . aspirin 81 MG tablet Take 81 mg by mouth daily.    . clopidogrel (PLAVIX) 75 MG tablet Take 75 mg by mouth once.     . Garlic 10 MG CAPS Take by mouth.    . losartan (COZAAR) 100 MG tablet Take 100 mg by mouth daily.     . Multiple Vitamin (MULTIVITAMIN) tablet Take 1 tablet by mouth daily.    . vitamin E (VITAMIN E) 400 UNIT capsule Take 400 Units by mouth daily.    . cetirizine (ZYRTEC) 10 MG tablet Take 1 tablet (10 mg total) by mouth daily. (Patient not taking: Reported on 10/03/2016) 30 tablet 0  . ranitidine (ZANTAC) 300 MG tablet Take 1 tablet (300 mg total) by mouth at bedtime. (Patient not taking: Reported on 10/03/2016) 30 tablet 0   No current facility-administered medications for this visit.     Past Medical History:  Diagnosis Date  . Carotid artery occlusion   . Hyperlipidemia   . Hypertension     Social History Social History  Substance Use Topics  . Smoking status: Never Smoker  . Smokeless tobacco: Never Used  . Alcohol use Yes     Comment: occ beer    Family History Family History  Problem Relation Age of Onset  . Stroke Mother   .  Hypertension Mother   . Heart disease Mother     Before age 67  . Heart attack Mother   . Stroke Father   . Diabetes Sister   . Hypertension Sister   . Hyperlipidemia Sister   . Hypertension Daughter   . Hypertension Sister   . Diabetes Sister     Surgical History History reviewed. No pertinent surgical history.  No Known Allergies  Current Outpatient Prescriptions  Medication Sig Dispense Refill  . amlodipine-atorvastatin (CADUET) 10-20 MG per tablet     . aspirin 81 MG tablet Take 81 mg by mouth daily.    . clopidogrel (PLAVIX) 75 MG tablet Take 75 mg by mouth once.     . Garlic 10 MG CAPS Take by mouth.    . losartan (COZAAR) 100 MG tablet Take 100 mg by mouth daily.     . Multiple Vitamin (MULTIVITAMIN) tablet Take 1 tablet by mouth daily.    . vitamin E (VITAMIN E) 400 UNIT capsule Take 400 Units by mouth daily.    . cetirizine (ZYRTEC) 10 MG tablet Take 1 tablet (10 mg total) by mouth daily. (Patient not taking: Reported on 10/03/2016) 30 tablet 0  . ranitidine (ZANTAC) 300 MG tablet Take 1 tablet (300 mg total) by mouth at bedtime. (Patient not taking: Reported on 10/03/2016) 30 tablet 0   No current facility-administered medications for this  visit.      REVIEW OF SYSTEMS: See HPI for pertinent positives and negatives.  Physical Examination Vitals:   10/03/16 1100 10/03/16 1102  BP: 131/73 139/74  Pulse: 77   Resp: 16   SpO2: 98%   Weight: 200 lb 4.8 oz (90.9 kg)   Height: 5\' 9"  (1.753 m)    Body mass index is 29.58 kg/m.  General:  WDWN male in NAD GAIT: normal Eyes: PERRLA Pulmonary: Respirations are non labored, CTAB, no rales,  rhonchi, or wheezing.  Cardiac: regular rhythm, no detected murmur  VASCULAR EXAM Carotid Bruits Left Right   Negative Negative   Aorta is not palpable. Radial pulses are 2+ palpable and equal.       LE Pulses LEFT RIGHT   POPLITEAL not palpable  not palpable   POSTERIOR TIBIAL  palpable   palpable    DORSALIS PEDIS  ANTERIOR TIBIAL palpable  palpable     Gastrointestinal: soft, nontender, BS WNL, no r/g, no palpable masses.  Musculoskeletal: No muscle atrophy/wasting. M/S 5/5 throughout, Extremities without ischemic changes.  Neurologic: A&O X 3; Appropriate Affect, Speech is normal CN 2-12 intact, Pain and light touch intact in extremities, Motor exam as listed above      ASSESSMENT:  Roger [name] is a 66 y.o. male who has no hx of stroke or TIA.  Fortunately he does not have DM and has never used tobacco. His wife smokes but not in their house. He denies any known hx of CAD. He takes a statin, ASA, and Plavix.  DATA Today's carotid duplex suggests  40%-59% bilateral ICA stenosis. Bilateral external carotid artery stenosis present. Bilateral vertebral artery flow is antegrade.  Bilateral subclavian artery waveforms are normal.  Slight increase in stenosis of the left ICA since the last exam on 09-28-15.    PLAN:   Based on today's exam and non-invasive vascular lab results, the patient will follow up in 1 year with the following tests: carotid duplex. I discussed in depth with the patient the nature of atherosclerosis, and emphasized the importance of maximal medical management including strict control of blood pressure, blood glucose, and lipid levels, obtaining regular exercise, and cessation of smoking.  The patient is aware that without maximal medical management the underlying atherosclerotic disease process will progress, limiting the benefit of any interventions.  The patient was given information about stroke prevention and what symptoms should prompt the patient to seek immediate medical care.  Thank  you for allowing [place] to participate in this patient's care.  Charisse March, RN, MSN, FNP-C [facility] Office: [phone]  Clinic MD: [facility] 10/03/2016 11:11 AM

## 2016-10-08 NOTE — Addendum Note (Signed)
Addended by: Burton ApleyPETTY, Camrin Lapre A on: 10/08/2016 03:00 PM   Modules accepted: Orders

## 2016-10-10 ENCOUNTER — Encounter (INDEPENDENT_AMBULATORY_CARE_PROVIDER_SITE_OTHER): Payer: Managed Care, Other (non HMO) | Admitting: Ophthalmology

## 2016-10-10 DIAGNOSIS — H353221 Exudative age-related macular degeneration, left eye, with active choroidal neovascularization: Secondary | ICD-10-CM | POA: Diagnosis not present

## 2016-10-10 DIAGNOSIS — H35033 Hypertensive retinopathy, bilateral: Secondary | ICD-10-CM | POA: Diagnosis not present

## 2016-10-10 DIAGNOSIS — H43813 Vitreous degeneration, bilateral: Secondary | ICD-10-CM | POA: Diagnosis not present

## 2016-10-10 DIAGNOSIS — I10 Essential (primary) hypertension: Secondary | ICD-10-CM | POA: Diagnosis not present

## 2016-10-10 DIAGNOSIS — H2513 Age-related nuclear cataract, bilateral: Secondary | ICD-10-CM

## 2016-12-05 ENCOUNTER — Encounter (INDEPENDENT_AMBULATORY_CARE_PROVIDER_SITE_OTHER): Payer: Managed Care, Other (non HMO) | Admitting: Ophthalmology

## 2016-12-05 DIAGNOSIS — I10 Essential (primary) hypertension: Secondary | ICD-10-CM

## 2016-12-05 DIAGNOSIS — H35033 Hypertensive retinopathy, bilateral: Secondary | ICD-10-CM

## 2016-12-05 DIAGNOSIS — H43813 Vitreous degeneration, bilateral: Secondary | ICD-10-CM

## 2016-12-05 DIAGNOSIS — H353221 Exudative age-related macular degeneration, left eye, with active choroidal neovascularization: Secondary | ICD-10-CM | POA: Diagnosis not present

## 2017-01-31 ENCOUNTER — Encounter (INDEPENDENT_AMBULATORY_CARE_PROVIDER_SITE_OTHER): Payer: Managed Care, Other (non HMO) | Admitting: Ophthalmology

## 2017-01-31 DIAGNOSIS — H353221 Exudative age-related macular degeneration, left eye, with active choroidal neovascularization: Secondary | ICD-10-CM | POA: Diagnosis not present

## 2017-01-31 DIAGNOSIS — I10 Essential (primary) hypertension: Secondary | ICD-10-CM | POA: Diagnosis not present

## 2017-01-31 DIAGNOSIS — H43813 Vitreous degeneration, bilateral: Secondary | ICD-10-CM | POA: Diagnosis not present

## 2017-01-31 DIAGNOSIS — H35033 Hypertensive retinopathy, bilateral: Secondary | ICD-10-CM

## 2017-03-14 ENCOUNTER — Encounter (INDEPENDENT_AMBULATORY_CARE_PROVIDER_SITE_OTHER): Payer: Managed Care, Other (non HMO) | Admitting: Ophthalmology

## 2017-04-11 DIAGNOSIS — J309 Allergic rhinitis, unspecified: Secondary | ICD-10-CM | POA: Diagnosis not present

## 2017-04-11 DIAGNOSIS — E669 Obesity, unspecified: Secondary | ICD-10-CM | POA: Diagnosis not present

## 2017-04-11 DIAGNOSIS — R109 Unspecified abdominal pain: Secondary | ICD-10-CM | POA: Diagnosis not present

## 2017-04-11 DIAGNOSIS — I10 Essential (primary) hypertension: Secondary | ICD-10-CM | POA: Diagnosis not present

## 2017-04-11 DIAGNOSIS — E785 Hyperlipidemia, unspecified: Secondary | ICD-10-CM | POA: Diagnosis not present

## 2017-04-11 DIAGNOSIS — M1990 Unspecified osteoarthritis, unspecified site: Secondary | ICD-10-CM | POA: Diagnosis not present

## 2017-04-14 DIAGNOSIS — E785 Hyperlipidemia, unspecified: Secondary | ICD-10-CM | POA: Diagnosis not present

## 2017-04-14 DIAGNOSIS — I10 Essential (primary) hypertension: Secondary | ICD-10-CM | POA: Diagnosis not present

## 2017-04-16 ENCOUNTER — Encounter: Payer: Self-pay | Admitting: Physician Assistant

## 2017-04-16 ENCOUNTER — Ambulatory Visit (INDEPENDENT_AMBULATORY_CARE_PROVIDER_SITE_OTHER): Payer: Medicare Other | Admitting: Physician Assistant

## 2017-04-16 VITALS — BP 132/73 | HR 91 | Temp 99.2°F | Resp 18 | Ht 69.0 in | Wt 195.0 lb

## 2017-04-16 DIAGNOSIS — R1032 Left lower quadrant pain: Secondary | ICD-10-CM

## 2017-04-16 DIAGNOSIS — K5900 Constipation, unspecified: Secondary | ICD-10-CM

## 2017-04-16 LAB — POCT URINALYSIS DIP (MANUAL ENTRY)
BILIRUBIN UA: NEGATIVE
BILIRUBIN UA: NEGATIVE mg/dL
Glucose, UA: NEGATIVE mg/dL
Leukocytes, UA: NEGATIVE
Nitrite, UA: NEGATIVE
Spec Grav, UA: 1.02 (ref 1.010–1.025)
UROBILINOGEN UA: 0.2 U/dL
pH, UA: 6 (ref 5.0–8.0)

## 2017-04-16 MED ORDER — POLYETHYLENE GLYCOL 3350 17 GM/SCOOP PO POWD: 17.0000 g | Freq: Two times a day (BID) | ORAL | 1 refills | Status: DC | PRN

## 2017-04-16 NOTE — Patient Instructions (Addendum)
Please take this twice per day no more than 1 week. If you would like to do it in an off-label sense faster.  Then repeat every 10 minutes, 8 times.  That would equal 2L.     Constipation, Adult Constipation is when a person:  Poops (has a bowel movement) fewer times in a week than normal.  Has a hard time pooping.  Has poop that is dry, hard, or bigger than normal.  Follow these instructions at home: Eating and drinking   Eat foods that have a lot of fiber, such as: ? Fresh fruits and vegetables. ? Whole grains. ? Beans.  Eat less of foods that are high in fat, low in fiber, or overly processed, such as: ? [place] fries. ? Hamburgers. ? Cookies. ? Candy. ? Soda.  Drink enough fluid to keep your pee (urine) clear or pale yellow. General instructions  Exercise regularly or as told by your doctor.  Go to the restroom when you feel like you need to poop. Do not hold it in.  Take over-the-counter and prescription medicines only as told by your doctor. These include any fiber supplements.  Do pelvic floor retraining exercises, such as: ? Doing deep breathing while relaxing your lower belly (abdomen). ? Relaxing your pelvic floor while pooping.  Watch your condition for any changes.  Keep all follow-up visits as told by your doctor. This is important. Contact a doctor if:  You have pain that gets worse.  You have a fever.  You have not pooped for 4 days.  You throw up (vomit).  You are not hungry.  You lose weight.  You are bleeding from the anus.  You have thin, pencil-like poop (stool). Get help right away if:  You have a fever, and your symptoms suddenly get worse.  You leak poop or have blood in your poop.  Your belly feels hard or bigger than normal (is bloated).  You have very bad belly pain.  You feel dizzy or you faint. This information is not intended to replace advice given to you by your health care provider. Make sure you discuss any  questions you have with your health care provider. Document Released: 03/04/2008 Document Revised: 04/05/2016 Document Reviewed: 03/06/2016 Elsevier Interactive Patient Education  2017 [employer]    IF you received an x-ray today, you will receive an invoice from [facility]. Please contact [facility] at [phone] with questions or concerns regarding your invoice.   IF you received labwork today, you will receive an invoice from [facility]. Please contact [facility] at [phone] with questions or concerns regarding your invoice.   Our billing staff will not be able to assist you with questions regarding bills from these companies.  You will be contacted with the lab results as soon as they are available. The fastest way to get your results is to activate your My Chart account. Instructions are located on the last page of this paperwork. If you have not heard from us regarding the results in 2 weeks, please contact this office.

## 2017-04-16 NOTE — Progress Notes (Signed)
PRIMARY CARE AT [facility] [address] [place] [address] [phone]  Date:  04/16/2017   Name:  Roger [name]   DOB:  Feb 26, 1951   MRN:  [id number]  PCP:  Donia Guiles, MD    History of Present Illness:  Roger [name] is a 66 y.o. male patient who presents to PCP with  Chief Complaint  Patient presents with  . Abdominal Pain    very lower middle area, pt believes he is constipated, doesn't hut to urinate or a bowel movement      2nd week of lower abdominal pain. He had 6 days of without a bowel movement without defecation.     He was eating .  Watery stool .  Non-bloody.  No pain with defecation.  He has no issues without prostate.   Lower GI pain when he sitting on the toilet.  Urination normal without weakened stream, hematuria, or frequency.    Patient Active Problem List   Diagnosis Date Noted  . 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 Occlusion and stenosis of carotid artery without mention of cerebral infarction 08/13/2012    Past Medical History:  Diagnosis Date  . Carotid artery occlusion   . Hyperlipidemia   . Hypertension     No past surgical history on file.  Social History  Substance Use Topics  . Smoking status: Never Smoker  . Smokeless tobacco: Never Used  . Alcohol use Yes     Comment: occ beer    Family History  Problem Relation Age of Onset  . Stroke Mother   . Hypertension Mother   . Heart disease Mother        Before age 56  . Heart attack Mother   . Stroke Father   . Diabetes Sister   . Hypertension Sister   . Hyperlipidemia Sister   . Hypertension Daughter   . Hypertension Sister   . Diabetes Sister     No Known Allergies  Medication list has been reviewed and updated.  Current Outpatient Prescriptions on File Prior to Visit  Medication Sig Dispense Refill  . amlodipine-atorvastatin (CADUET) 10-20 MG per tablet     . aspirin 81 MG tablet Take 81 mg by mouth daily.    . clopidogrel (PLAVIX) 75 MG tablet Take 75 mg by mouth  once.     . Garlic 10 MG CAPS Take by mouth.    . losartan (COZAAR) 100 MG tablet Take 100 mg by mouth daily.     . Multiple Vitamin (MULTIVITAMIN) tablet Take 1 tablet by mouth daily.    . cetirizine (ZYRTEC) 10 MG tablet Take 1 tablet (10 mg total) by mouth daily. (Patient not taking: Reported on 10/03/2016) 30 tablet 0  . ranitidine (ZANTAC) 300 MG tablet Take 1 tablet (300 mg total) by mouth at bedtime. (Patient not taking: Reported on 10/03/2016) 30 tablet 0  . vitamin E (VITAMIN E) 400 UNIT capsule Take 400 Units by mouth daily.     No current facility-administered medications on file prior to visit.     ROS ROS otherwise unremarkable unless listed above.  Physical Examination: BP 132/73   Pulse 91   Temp 99.2 F (37.3 C) (Oral)   Resp 18   Ht 5\' 9"  (1.753 m)   Wt 195 lb (88.5 kg)   SpO2 97%   BMI 28.80 kg/m  Ideal Body Weight: Weight in (lb) to have BMI = 25: 168.9  Physical Exam  Constitutional: He is oriented to person, place,  and time. He appears well-developed and well-nourished. No distress.  HENT:  Head: Normocephalic and atraumatic.  Eyes: Pupils are equal, round, and reactive to light. Conjunctivae and EOM are normal.  Cardiovascular: Normal rate.   Pulmonary/Chest: Effort normal. No respiratory distress.  Abdominal: Soft. Normal appearance and bowel sounds are normal. There is tenderness in the suprapubic area.  Genitourinary: Prostate normal. Prostate is not enlarged and not tender.  Neurological: He is alert and oriented to person, place, and time.  Skin: Skin is warm and dry. He is not diaphoretic.  Psychiatric: He has a normal mood and affect. His behavior is normal.   Results for orders placed or performed in visit on 04/16/17  POCT urinalysis dipstick  Result Value Ref Range   Color, UA yellow yellow   Clarity, UA clear clear   Glucose, UA negative negative mg/dL   Bilirubin, UA negative negative   Ketones, POC UA negative negative mg/dL   Spec Grav,  UA 1.1911.020 1.010 - 1.025   Blood, UA small (A) negative   pH, UA 6.0 5.0 - 8.0   Protein Ur, POC trace (A) negative mg/dL   Urobilinogen, UA 0.2 0.2 or 1.0 E.U./dL   Nitrite, UA Negative Negative   Leukocytes, UA Negative Negative     Assessment and Plan: Roger [name] is a 66 y.o. male who is here today  Rtc in 2 weeks for urine recheck More constipation, low likelihood prostate related, or diverticulitis  Vitals wnl Given miralax twice per day.   Will rtc if no improvement. Abdominal pain, left lower quadrant - Plan: POCT urinalysis dipstick, polyethylene glycol powder (GLYCOLAX/MIRALAX) powder  Constipation, unspecified constipation type - Plan: polyethylene glycol powder (GLYCOLAX/MIRALAX) powder  Trena PlattStephanie Tomeca Helm, PA-C [facility] 7/19/20189:18 AM

## 2017-07-11 DIAGNOSIS — E785 Hyperlipidemia, unspecified: Secondary | ICD-10-CM | POA: Diagnosis not present

## 2017-07-11 DIAGNOSIS — J309 Allergic rhinitis, unspecified: Secondary | ICD-10-CM | POA: Diagnosis not present

## 2017-07-11 DIAGNOSIS — N529 Male erectile dysfunction, unspecified: Secondary | ICD-10-CM | POA: Diagnosis not present

## 2017-07-11 DIAGNOSIS — I10 Essential (primary) hypertension: Secondary | ICD-10-CM | POA: Diagnosis not present

## 2017-07-11 DIAGNOSIS — M1990 Unspecified osteoarthritis, unspecified site: Secondary | ICD-10-CM | POA: Diagnosis not present

## 2017-10-09 ENCOUNTER — Ambulatory Visit (INDEPENDENT_AMBULATORY_CARE_PROVIDER_SITE_OTHER): Payer: Medicare Other | Admitting: Family

## 2017-10-09 ENCOUNTER — Encounter: Payer: Self-pay | Admitting: Family

## 2017-10-09 ENCOUNTER — Ambulatory Visit (HOSPITAL_COMMUNITY)
Admission: RE | Admit: 2017-10-09 | Discharge: 2017-10-09 | Disposition: A | Discharge status: Home / Self Care | Payer: Medicare Other | Source: Ambulatory Visit | Attending: Family | Admitting: Family

## 2017-10-09 VITALS — BP 128/73 | HR 74 | Resp 20 | Ht 69.0 in | Wt 199.0 lb

## 2017-10-09 DIAGNOSIS — I6523 Occlusion and stenosis of bilateral carotid arteries: Secondary | ICD-10-CM

## 2017-10-09 LAB — VAS US CAROTID
LEFT ECA DIAS: -28 cm/s
LEFT VERTEBRAL DIAS: 13 cm/s
LICADDIAS: -20 cm/s
Left CCA dist dias: -19 cm/s
Left CCA dist sys: -99 cm/s
Left CCA prox dias: 22 cm/s
Left CCA prox sys: 137 cm/s
Left ICA dist sys: -70 cm/s
Left ICA prox dias: -25 cm/s
Left ICA prox sys: -101 cm/s
RIGHT CCA MID DIAS: 21 cm/s
RIGHT ECA DIAS: -13 cm/s
RIGHT VERTEBRAL DIAS: 10 cm/s
Right CCA prox dias: 17 cm/s
Right CCA prox sys: 100 cm/s
Right cca dist sys: -78 cm/s

## 2017-10-09 NOTE — Patient Instructions (Signed)

## 2017-10-09 NOTE — Progress Notes (Signed)
Chief Complaint: Follow up Extracranial Carotid Artery Stenosis   History of Present Illness  Roger [name] is a 67 y.o. male patient of Dr. Darrick Penna who has known carotid artery stenosis, returns today for follow up.  The patient denies any hx of TIA or stroke symptoms.Specifically he denies a hx of amaurosis fugax or monocular blindness, unilateral facial drooping, hemiplegia, or receptive or expressive aphasia.  He denies claudication symptoms with walking, denies non-healing wounds.   Pt Diabetic: No Pt smoker: non-smoker  Pt meds include: Statin : Yes ASA: Yes Other anticoagulants/antiplatelets: Plavix     Past Medical History:  Diagnosis Date  . Carotid artery occlusion   . Hyperlipidemia   . Hypertension     Social History Social History   Tobacco Use  . Smoking status: Never Smoker  . Smokeless tobacco: Never Used  Substance Use Topics  . Alcohol use: Yes    Comment: occ beer  . Drug use: No    Family History Family History  Problem Relation Age of Onset  . Stroke Mother   . Hypertension Mother   . Heart disease Mother        Before age 30  . Heart attack Mother   . Stroke Father   . Diabetes Sister   . Hypertension Sister   . Hyperlipidemia Sister   . Hypertension Daughter   . Hypertension Sister   . Diabetes Sister     Surgical History History reviewed. No pertinent surgical history.  No Known Allergies  Current Outpatient Medications  Medication Sig Dispense Refill  . amlodipine-atorvastatin (CADUET) 10-20 MG per tablet     . aspirin 81 MG tablet Take 81 mg by mouth daily.    . clopidogrel (PLAVIX) 75 MG tablet Take 75 mg by mouth once.     . Garlic 10 MG CAPS Take by mouth.    . losartan (COZAAR) 100 MG tablet Take 100 mg by mouth daily.     . Multiple Vitamin (MULTIVITAMIN) tablet Take 1 tablet by mouth daily.    . vitamin E (VITAMIN E) 400 UNIT capsule Take 400 Units by mouth daily.    . cetirizine (ZYRTEC) 10 MG tablet Take  1 tablet (10 mg total) by mouth daily. (Patient not taking: Reported on 10/03/2016) 30 tablet 0  . polyethylene glycol powder (GLYCOLAX/MIRALAX) powder Take 17 g by mouth 2 (two) times daily as needed. (Patient not taking: Reported on 10/09/2017) 289 g 1  . ranitidine (ZANTAC) 300 MG tablet Take 1 tablet (300 mg total) by mouth at bedtime. (Patient not taking: Reported on 10/03/2016) 30 tablet 0   No current facility-administered medications for this visit.     Review of Systems : See HPI for pertinent positives and negatives.  Physical Examination  Vitals:   10/09/17 1044 10/09/17 1046  BP: 125/71 128/73  Pulse: 74   Resp: 20   SpO2: 97%   Weight: 199 lb (90.3 kg)   Height: 5\' 9"  (1.753 m)    Body mass index is 29.39 kg/m.  General:  WDWN male in NAD GAIT:normal Eyes: PERRLA Pulmonary: Respirations are non labored, CTAB, no rales, rhonchi, or wheezing.  Cardiac: regular rhythm, no detected murmur  VASCULAR EXAM Carotid Bruits Left Right   Negative Negative    Abdominal aortic pulse is not palpable. Radial pulses are 2+ palpable and equal.      LE Pulses LEFT RIGHT   POPLITEAL not palpable  not palpable   POSTERIOR TIBIAL  palpable  palpable    DORSALIS PEDIS  ANTERIOR TIBIAL palpable  palpable     Gastrointestinal: soft, nontender, BS WNL, no r/g, no palpable masses.  Musculoskeletal: No muscle atrophy/wasting. M/S 5/5 throughout, Extremities without ischemic changes.  Skin: No rash, no cellulitis, no ulcers noted.   Neurologic: A&O X 3; appropriate affect, speech is normal, CN 2-12 intact, Pain and light touch intact in extremities, Motor exam as listed above.  Psychiatric: Mood appropriate for clinical situation.       Assessment: Roger [name] is a 67 y.o.  male who has no hx of stroke or TIA.  Fortunately he does not have DM and has never used tobacco. His wife smokes but not in their house. He denies any known hx of CAD. He takes a statin, ASA, and Plavix.  DATA Carotid Duplex (10/09/17): 1%-39% bilateral ICA stenosis. Bilateral external carotid artery stenosis present. Bilateral vertebral artery flow is antegrade.  Bilateral subclavian artery waveforms are normal.  Bilateral ICA stenosis has been downgraded, velocities not as high as the exam on 10-03-16.    PLAN:   Based on today's exam and non-invasive vascular lab results, the patient will follow up in 1 year with the following tests: carotid duplex.   I discussed in depth with the patient the nature of atherosclerosis, and emphasized the importance of maximal medical management including strict control of blood pressure, blood glucose, and lipid levels, obtaining regular exercise, and continued cessation of smoking.  The patient is aware that without maximal medical management the underlying atherosclerotic disease process will progress, limiting the benefit of any interventions. The patient was given information about stroke prevention and what symptoms should prompt the patient to seek immediate medical care. Thank you for allowing us to participate in this patient's care.  Charisse MarchSuzanne Mykael Batz, RN, MSN, FNP-C [facility] Office: [phone]  Clinic Physician: Darrick PennaFields  10/09/17 10:52 AM

## 2017-10-10 DIAGNOSIS — E785 Hyperlipidemia, unspecified: Secondary | ICD-10-CM | POA: Diagnosis not present

## 2017-10-10 DIAGNOSIS — M1990 Unspecified osteoarthritis, unspecified site: Secondary | ICD-10-CM | POA: Diagnosis not present

## 2017-10-10 DIAGNOSIS — I10 Essential (primary) hypertension: Secondary | ICD-10-CM | POA: Diagnosis not present

## 2017-10-10 DIAGNOSIS — J309 Allergic rhinitis, unspecified: Secondary | ICD-10-CM | POA: Diagnosis not present

## 2017-12-31 ENCOUNTER — Encounter: Payer: Self-pay | Admitting: Physician Assistant

## 2018-01-09 DIAGNOSIS — E785 Hyperlipidemia, unspecified: Secondary | ICD-10-CM | POA: Diagnosis not present

## 2018-01-09 DIAGNOSIS — I10 Essential (primary) hypertension: Secondary | ICD-10-CM | POA: Diagnosis not present

## 2018-01-09 DIAGNOSIS — J309 Allergic rhinitis, unspecified: Secondary | ICD-10-CM | POA: Diagnosis not present

## 2018-01-09 DIAGNOSIS — M1990 Unspecified osteoarthritis, unspecified site: Secondary | ICD-10-CM | POA: Diagnosis not present

## 2018-01-13 DIAGNOSIS — H25013 Cortical age-related cataract, bilateral: Secondary | ICD-10-CM | POA: Diagnosis not present

## 2018-01-13 DIAGNOSIS — H40013 Open angle with borderline findings, low risk, bilateral: Secondary | ICD-10-CM | POA: Diagnosis not present

## 2018-01-13 DIAGNOSIS — H2513 Age-related nuclear cataract, bilateral: Secondary | ICD-10-CM | POA: Diagnosis not present

## 2018-01-13 DIAGNOSIS — H353221 Exudative age-related macular degeneration, left eye, with active choroidal neovascularization: Secondary | ICD-10-CM | POA: Diagnosis not present

## 2018-01-21 DIAGNOSIS — I10 Essential (primary) hypertension: Secondary | ICD-10-CM | POA: Diagnosis not present

## 2018-01-21 DIAGNOSIS — E785 Hyperlipidemia, unspecified: Secondary | ICD-10-CM | POA: Diagnosis not present

## 2018-04-08 DIAGNOSIS — N529 Male erectile dysfunction, unspecified: Secondary | ICD-10-CM | POA: Diagnosis not present

## 2018-04-08 DIAGNOSIS — E785 Hyperlipidemia, unspecified: Secondary | ICD-10-CM | POA: Diagnosis not present

## 2018-04-08 DIAGNOSIS — M1990 Unspecified osteoarthritis, unspecified site: Secondary | ICD-10-CM | POA: Diagnosis not present

## 2018-04-08 DIAGNOSIS — J309 Allergic rhinitis, unspecified: Secondary | ICD-10-CM | POA: Diagnosis not present

## 2018-04-08 DIAGNOSIS — I10 Essential (primary) hypertension: Secondary | ICD-10-CM | POA: Diagnosis not present

## 2018-07-08 DIAGNOSIS — M1990 Unspecified osteoarthritis, unspecified site: Secondary | ICD-10-CM | POA: Diagnosis not present

## 2018-07-08 DIAGNOSIS — E669 Obesity, unspecified: Secondary | ICD-10-CM | POA: Diagnosis not present

## 2018-07-08 DIAGNOSIS — J309 Allergic rhinitis, unspecified: Secondary | ICD-10-CM | POA: Diagnosis not present

## 2018-07-08 DIAGNOSIS — I10 Essential (primary) hypertension: Secondary | ICD-10-CM | POA: Diagnosis not present

## 2018-07-08 DIAGNOSIS — E785 Hyperlipidemia, unspecified: Secondary | ICD-10-CM | POA: Diagnosis not present

## 2018-07-08 DIAGNOSIS — N529 Male erectile dysfunction, unspecified: Secondary | ICD-10-CM | POA: Diagnosis not present

## 2018-07-10 DIAGNOSIS — I10 Essential (primary) hypertension: Secondary | ICD-10-CM | POA: Diagnosis not present

## 2018-07-10 DIAGNOSIS — E785 Hyperlipidemia, unspecified: Secondary | ICD-10-CM | POA: Diagnosis not present

## 2018-08-03 DIAGNOSIS — E669 Obesity, unspecified: Secondary | ICD-10-CM | POA: Diagnosis not present

## 2018-08-03 DIAGNOSIS — J309 Allergic rhinitis, unspecified: Secondary | ICD-10-CM | POA: Diagnosis not present

## 2018-08-03 DIAGNOSIS — E785 Hyperlipidemia, unspecified: Secondary | ICD-10-CM | POA: Diagnosis not present

## 2018-08-03 DIAGNOSIS — I10 Essential (primary) hypertension: Secondary | ICD-10-CM | POA: Diagnosis not present

## 2018-08-03 DIAGNOSIS — M1990 Unspecified osteoarthritis, unspecified site: Secondary | ICD-10-CM | POA: Diagnosis not present

## 2018-08-03 DIAGNOSIS — N529 Male erectile dysfunction, unspecified: Secondary | ICD-10-CM | POA: Diagnosis not present

## 2018-10-07 DIAGNOSIS — M1990 Unspecified osteoarthritis, unspecified site: Secondary | ICD-10-CM | POA: Diagnosis not present

## 2018-10-07 DIAGNOSIS — I10 Essential (primary) hypertension: Secondary | ICD-10-CM | POA: Diagnosis not present

## 2018-10-07 DIAGNOSIS — E785 Hyperlipidemia, unspecified: Secondary | ICD-10-CM | POA: Diagnosis not present

## 2018-10-07 DIAGNOSIS — L309 Dermatitis, unspecified: Secondary | ICD-10-CM | POA: Diagnosis not present

## 2018-10-12 ENCOUNTER — Other Ambulatory Visit: Payer: Self-pay

## 2018-10-12 DIAGNOSIS — I6523 Occlusion and stenosis of bilateral carotid arteries: Secondary | ICD-10-CM

## 2018-10-16 ENCOUNTER — Ambulatory Visit (HOSPITAL_COMMUNITY)
Admission: RE | Admit: 2018-10-16 | Discharge: 2018-10-16 | Disposition: A | Discharge status: Home / Self Care | Payer: Medicare Other | Source: Ambulatory Visit | Attending: Family | Admitting: Family

## 2018-10-16 ENCOUNTER — Encounter: Payer: Self-pay | Admitting: Family

## 2018-10-16 ENCOUNTER — Ambulatory Visit (INDEPENDENT_AMBULATORY_CARE_PROVIDER_SITE_OTHER): Payer: Medicare Other | Admitting: Family

## 2018-10-16 ENCOUNTER — Other Ambulatory Visit: Payer: Self-pay

## 2018-10-16 VITALS — BP 135/77 | HR 68 | Resp 18 | Ht 69.0 in | Wt 200.0 lb

## 2018-10-16 DIAGNOSIS — I6523 Occlusion and stenosis of bilateral carotid arteries: Secondary | ICD-10-CM

## 2018-10-16 DIAGNOSIS — Z7722 Contact with and (suspected) exposure to environmental tobacco smoke (acute) (chronic): Secondary | ICD-10-CM | POA: Diagnosis not present

## 2018-10-16 NOTE — Patient Instructions (Addendum)
Preventing Exposure to Secondhand Smoke, Adult Secondhand smoke is smoke that comes from burning tobacco. It includes smoke from cigarettes, pipes, or cigars. Being exposed to secondhand smoke is as dangerous as smoking. Common places you may be exposed to secondhand smoke include:  Work.  Public places, like restaurants, shopping centers, and parks.  Home, especially if you live in an apartment building. How can secondhand smoke affect me? There is no safe level of secondhand smoke. Smoke from cigarettes contains thousands of chemicals, including chemicals that are known to cause cancer. Secondhand smoke can cause many health problems, such as:  Cancer.  Heart disease.  Stroke.  Pregnancy problems, such as pregnancy loss (miscarriage), low birth weight, and early birth (premature). What actions can I take to prevent exposure to secondhand smoke?   Do not smoke.  Keep your home smoke-free.  Do not allow smoking in your car.  Avoid public places where smoking is allowed.  Talk to your employer about your company's policies on smoking. ? Your workplace should have a policy separating smoking areas from nonsmoking areas. ? Smoking areas should have a system for ventilating and cleaning the air. Where to find more information Centers for Disease Control and Prevention (CDC): https://spence.com/https://www.cdc.gov/tobacco/ American Cancer Society: https://www.cancer.org American Heart Association: https://www.heart.org Summary  Secondhand smoke is smoke that comes from burning tobacco. Secondhand smoke exposes you to the dangers of smoking, even if you are not the one smoking.  There is no safe level of secondhand smoke. Several chemicals in secondhand smoke are known to cause cancer. Secondhand smoke can also cause many other health problems.  To prevent exposure to secondhand smoke, do not smoke, discourage others from smoking, keep your home and car smoke-free, and avoid places where  smoking is allowed. This information is not intended to replace advice given to you by your health care provider. Make sure you discuss any questions you have with your health care provider. Document Released: 10/24/2004 Document Revised: 10/23/2017 Document Reviewed: 10/23/2017 Elsevier Interactive Patient Education  2019 [employer] Stroke Prevention Some medical conditions and lifestyle choices can lead to a higher risk for a stroke. You can help to prevent a stroke by making nutrition, lifestyle, and other changes. What nutrition changes can be made?   Eat healthy foods. ? Choose foods that are high in fiber. These include:  Fresh fruits.  Fresh vegetables.  Whole grains. ? Eat at least 5 or more servings of fruits and vegetables each day. Try to fill half of your plate at each meal with fruits and vegetables. ? Choose lean protein foods. These include:  Lowfat (lean) cuts of meat.  Chicken without skin.  Fish.  Tofu.  Beans.  Nuts. ? Eat low-fat dairy products. ? Avoid foods that:  Are high in salt (sodium).  Have saturated fat.  Have trans fat.  Have cholesterol.  Are processed.  Are premade.  Follow eating guidelines as told by your doctor. These may include: ? Reducing how many calories you eat and drink each day. ? Limiting how much salt you eat or drink each day to 1,500 milligrams (mg). ? Using only healthy fats for cooking. These include:  Olive oil.  Canola oil.  Sunflower oil. ? Counting how many carbohydrates you eat and drink each day. What lifestyle changes can be made?  Try to stay at a healthy weight. Talk to your doctor about what a good weight is for you.  Get at least 30 minutes of moderate physical activity  at least 5 days a week. This can include: ? Fast walking. ? Biking. ? Swimming.  Do not use any products that have nicotine or tobacco. This includes cigarettes and e-cigarettes. If you need help quitting, ask your doctor.  Avoid being around tobacco smoke in general.  Limit how much alcohol you drink to no more than 1 drink a day for nonpregnant women and 2 drinks a day for men. One drink equals 12 oz of beer, 5 oz of wine, or 1 oz of hard liquor.  Do not use drugs.  Avoid taking birth control pills. Talk to your doctor about the risks of taking birth control pills if: ? You are over 73 years old. ? You smoke. ? You get migraines. ? You have had a blood clot. What other changes can be made?  Manage your cholesterol. ? It is important to eat a healthy diet. ? If your cholesterol cannot be managed through your diet, you may also need to take medicines. Take medicines as told by your doctor.  Manage your diabetes. ? It is important to eat a healthy diet and to exercise regularly. ? If your blood sugar cannot be managed through diet and exercise, you may need to take medicines. Take medicines as told by your doctor.  Control your high blood pressure (hypertension). ? Try to keep your blood pressure below 130/80. This can help lower your risk of stroke. ? It is important to eat a healthy diet and to exercise regularly. ? If your blood pressure cannot be managed through diet and exercise, you may need to take medicines. Take medicines as told by your doctor. ? Ask your doctor if you should check your blood pressure at home. ? Have your blood pressure checked every year. Do this even if your blood pressure is normal.  Talk to your doctor about getting checked for a sleep disorder. Signs of this can include: ? Snoring a lot. ? Feeling very tired.  Take over-the-counter and prescription medicines only as told by your doctor. These may include aspirin or blood thinners (antiplatelets or anticoagulants).  Make sure that any other medical conditions you have are managed. Where to find more information  American Stroke Association: www.strokeassociation.org  National Stroke Association: www.stroke.org Get  help right away if:  You have any symptoms of stroke. "BE FAST" is an easy way to remember the main warning signs: ? B - Balance. Signs are dizziness, sudden trouble walking, or loss of balance. ? E - Eyes. Signs are trouble seeing or a sudden change in how you see. ? F - Face. Signs are sudden weakness or loss of feeling of the face, or the face or eyelid drooping on one side. ? A - Arms. Signs are weakness or loss of feeling in an arm. This happens suddenly and usually on one side of the body. ? S - Speech. Signs are sudden trouble speaking, slurred speech, or trouble understanding what people say. ? T - Time. Time to call emergency services. Write down what time symptoms started.  You have other signs of stroke, such as: ? A sudden, very bad headache with no known cause. ? Feeling sick to your stomach (nausea). ? Throwing up (vomiting). ? Jerky movements you cannot control (seizure). These symptoms may represent a serious problem that is an emergency. Do not wait to see if the symptoms will go away. Get medical help right away. Call your local emergency services (911 in the [place] Do not drive yourself to  the hospital. Summary  You can prevent a stroke by eating healthy, exercising, not smoking, drinking less alcohol, and treating other health problems, such as diabetes, high blood pressure, or high cholesterol.  Do not use any products that contain nicotine or tobacco, such as cigarettes and e-cigarettes.  Get help right away if you have any signs or symptoms of a stroke. This information is not intended to replace advice given to you by your health care provider. Make sure you discuss any questions you have with your health care provider. Document Released: 03/17/2012 Document Revised: 12/18/2016 Document Reviewed: 12/18/2016 Elsevier Interactive Patient Education  2019 [employer]

## 2018-10-16 NOTE — Progress Notes (Signed)
Chief Complaint: Follow up Extracranial Carotid Artery Stenosis   History of Present Illness  Roger [name] is a 68 y.o. male whom Dr. Darrick PennaFields has monitored for extracranial carotid artery stenosis.  Pt denies any known history of stroke or TIA. Specifically he denies a history of symptoms c/w amaurosis fugax, unilateral facial drooping, hemiplegia, or receptive or expressive aphasic.   Patient has not had previous carotid artery intervention.  He denies claudication type symptoms with walking, denies non-healing wounds.  He experiences cramping in his legs at night, not when walking.    Diabetic: no Tobacco use: non-smoker, however he is exposed to his wife's secondhand smoke, and was exposed to his father's secondhand smoke  Pt meds include: Statin : yes ASA: yes Other anticoagulants/antiplatelets: Plavix   Past Medical History:  Diagnosis Date  . Carotid artery occlusion   . Hyperlipidemia   . Hypertension     Social History Social History   Tobacco Use  . Smoking status: Never Smoker  . Smokeless tobacco: Never Used  Substance Use Topics  . Alcohol use: Yes    Comment: occ beer  . Drug use: No    Family History Family History  Problem Relation Age of Onset  . Stroke Mother   . Hypertension Mother   . Heart disease Mother        Before age 69  . Heart attack Mother   . Stroke Father   . Diabetes Sister   . Hypertension Sister   . Hyperlipidemia Sister   . Hypertension Daughter   . Hypertension Sister   . Diabetes Sister     Surgical History History reviewed. No pertinent surgical history.  No Known Allergies  Current Outpatient Medications  Medication Sig Dispense Refill  . amlodipine-atorvastatin (CADUET) 10-20 MG per tablet     . aspirin 81 MG tablet Take 81 mg by mouth daily.    . clopidogrel (PLAVIX) 75 MG tablet Take 75 mg by mouth once.     . Garlic 10 MG CAPS Take by mouth.    . losartan (COZAAR) 100 MG tablet Take 100 mg by mouth  daily.     . Multiple Vitamin (MULTIVITAMIN) tablet Take 1 tablet by mouth daily.    . vitamin E (VITAMIN E) 400 UNIT capsule Take 400 Units by mouth daily.    . cetirizine (ZYRTEC) 10 MG tablet Take 1 tablet (10 mg total) by mouth daily. (Patient not taking: Reported on 10/03/2016) 30 tablet 0  . polyethylene glycol powder (GLYCOLAX/MIRALAX) powder Take 17 g by mouth 2 (two) times daily as needed. (Patient not taking: Reported on 10/09/2017) 289 g 1  . ranitidine (ZANTAC) 300 MG tablet Take 1 tablet (300 mg total) by mouth at bedtime. (Patient not taking: Reported on 10/03/2016) 30 tablet 0   No current facility-administered medications for this visit.     Review of Systems : See HPI for pertinent positives and negatives.  Physical Examination  Vitals:   10/16/18 1228 10/16/18 1229  BP: 135/75 135/77  Pulse: 68   Resp: 18   SpO2: 99%   Weight: 200 lb (90.7 kg)   Height: 5\' 9"  (1.753 m)    Body mass index is 29.53 kg/m.  General: WDWN male in NAD GAIT: normal Eyes: PERRLA HENT: No gross abnormalities.  Pulmonary:  Respirations are non-labored, good air movement in all fields, CTAB, no rales, rhonchi, or wheezes. Cardiac: regular rhythm, no detected murmur.  VASCULAR EXAM Carotid Bruits Right Left   Negative  Negative     Abdominal aortic pulse is not palpable. Radial pulses are 2+ palpable and equal.                                                                                                                            LE Pulses Right Left       POPLITEAL  not palpable   not palpable       POSTERIOR TIBIAL   palpable    palpable        DORSALIS PEDIS      ANTERIOR TIBIAL  palpable   palpable     Gastrointestinal: soft, nontender, BS WNL, no r/g, no palpable masses. Musculoskeletal: no muscle atrophy/wasting. M/S 5/5 throughout, extremities without ischemic changes. Skin: No rashes, no ulcers, no cellulitis.   Neurologic:  A&O X 3; appropriate affect, sensation is  normal; speech is normal, CN 2-12 intact, pain and light touch intact in extremities, motor exam as listed above. Psychiatric: Normal thought content, mood appropriate to clinical situation.    Assessment: Roger RockerCharles O [name] is a 68 y.o. male who has no hx of stroke or TIA.  Fortunately he does not have DM and has never used tobacco. His wife smokes in their house, secondhand tobacco smoke exposure, see Patient Instructions. He denies any known hx of CAD. He exercises daily. He takes a statin, ASA, and Plavix.  He states his PCP wants to know from the vascular standpoint if there is any reason to continue both the Plavix and ASA; he does not need to take both for a peripheral vascular reason. If there is another reason for pt to take dual antiplatelet therapy, must defer to his PCP.  From a peripheral vascular standpoint, pt just needs 81 mg ASA daily to reduce his risk of CVD event.    DATA  Carotid Duplex (10-16-18): 1-39% bilateral ICA stenosis. Bilateral vertebral artery flow is antegrade.  Bilateral subclavian artery waveforms are normal.  No significant change compared to the exam on 10-09-17.   Plan: Follow-up in 2 years with Carotid Duplex scan.    I discussed in depth with the patient the nature of atherosclerosis, and emphasized the importance of maximal medical management including strict control of blood pressure, blood glucose, and lipid levels, obtaining regular exercise, and continued cessation of smoking.  The patient is aware that without maximal medical management the underlying atherosclerotic disease process will progress, limiting the benefit of any interventions. The patient was given information about stroke prevention and what symptoms should prompt the patient to seek immediate medical care. Thank you for allowing us to participate in this patient's care.  Charisse MarchSuzanne Nickel, RN, MSN, FNP-C [facility] Office: [phone]  Clinic  Physician: Randie HeinzCain  10/16/18 12:50 PM

## 2018-12-04 ENCOUNTER — Emergency Department (HOSPITAL_COMMUNITY): Payer: Medicare Other

## 2018-12-04 ENCOUNTER — Encounter (HOSPITAL_COMMUNITY): Payer: Self-pay | Admitting: Emergency Medicine

## 2018-12-04 ENCOUNTER — Emergency Department (HOSPITAL_COMMUNITY)
Admission: EM | Admit: 2018-12-04 | Discharge: 2018-12-04 | Disposition: A | Discharge status: Home / Self Care | Payer: Medicare Other | Attending: Emergency Medicine | Admitting: Emergency Medicine

## 2018-12-04 ENCOUNTER — Other Ambulatory Visit: Payer: Self-pay

## 2018-12-04 DIAGNOSIS — I10 Essential (primary) hypertension: Secondary | ICD-10-CM | POA: Insufficient documentation

## 2018-12-04 DIAGNOSIS — Z7982 Long term (current) use of aspirin: Secondary | ICD-10-CM | POA: Insufficient documentation

## 2018-12-04 DIAGNOSIS — M545 Low back pain, unspecified: Secondary | ICD-10-CM

## 2018-12-04 DIAGNOSIS — Z79899 Other long term (current) drug therapy: Secondary | ICD-10-CM | POA: Diagnosis not present

## 2018-12-04 DIAGNOSIS — M5416 Radiculopathy, lumbar region: Secondary | ICD-10-CM | POA: Diagnosis not present

## 2018-12-04 DIAGNOSIS — M1990 Unspecified osteoarthritis, unspecified site: Secondary | ICD-10-CM | POA: Diagnosis not present

## 2018-12-04 DIAGNOSIS — E785 Hyperlipidemia, unspecified: Secondary | ICD-10-CM | POA: Diagnosis not present

## 2018-12-04 NOTE — ED Triage Notes (Signed)
Pt c/o lower back pain that radiates down left leg for "while". Reports that his PCP has sent him here.

## 2018-12-04 NOTE — ED Notes (Signed)
Patient ambulated to X-ray 

## 2018-12-04 NOTE — Discharge Instructions (Signed)
You have been seen today for back pain. Please read and follow all provided instructions. Return to the emergency room for worsening condition or new concerning symptoms.    1. Medications:  Continue taking meloxicam as prescribed.  This medication is used to treat arthritis.  It can help reduce pain, swelling and stiffness in the joints. Continue usual home medications Take medications as prescribed. Please review all of the medicines and only take them if you do not have an allergy to them.  2. Treatment: rest, drink plenty of fluids. 3. Follow Up: Please follow up with your primary doctor in 2-5 days for discussion of your diagnoses and further evaluation after today's visit; Call today to arrange your follow up.    It is also a possibility that you have an allergic reaction to any of the medicines that you have been prescribed - Everybody reacts differently to medications and while MOST people have no trouble with most medicines, you may have a reaction such as nausea, vomiting, rash, swelling, shortness of breath. If this is the case, please stop taking the medicine immediately and contact your physician.  ?

## 2018-12-04 NOTE — ED Provider Notes (Signed)
[facility]-EMERGENCY DEPT Provider Note   CSN: [id number] Arrival date & time: 12/04/18  1719    History   Chief Complaint Chief Complaint  Patient presents with  . Back Pain  . Leg Pain    HPI Roger [name] is a 68 y.o. male with history of carotid artery occlusion, hyperlipidemia, hypertension presenting to emergency department today with chief complaint of back pain x1 week.  Patient describes pain is located in left lower back and occasionally will radiate down his leg.  It has been intermittent.  Patient went to PCP yesterday and was prescribed meloxicam.  He took 1 pill prior to arrival and states his pain has significantly improved.  Patient reports his pain was 8 out of 10 before the meloxicam and now has improved to 4 out of 10 in severity.  He has a history of similar back pain he estimates 1 year ago.  Denies fever, history of cancer, urinary retention, bowel or bladder incontinence, numbness, weakness, saddle anesthesia, weight loss, IV drug use. Past Medical History:  Diagnosis Date  . Carotid artery occlusion   . Hyperlipidemia   . Hypertension     Patient Active Problem List   Diagnosis Date Noted  . 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 Occlusion and stenosis of carotid artery without mention of cerebral infarction 08/13/2012    History reviewed. No pertinent surgical history.      Home Medications    Prior to Admission medications   Medication Sig Start Date End Date Taking? Authorizing Provider  amlodipine-atorvastatin (CADUET) 10-20 MG per tablet  06/22/12  Yes [provider]  aspirin 81 MG tablet Take 81 mg by mouth daily.   Yes [provider]  Garlic 10 MG CAPS Take by mouth.   Yes [provider]  losartan (COZAAR) 100 MG tablet Take 100 mg by mouth daily.  08/03/12  Yes [provider]  meloxicam (MOBIC) 15 MG tablet Take 15 mg by mouth daily. 12/04/18  Yes [provider]  Multiple  Vitamin (MULTIVITAMIN) tablet Take 1 tablet by mouth daily.   Yes [provider]  vitamin E (VITAMIN E) 400 UNIT capsule Take 400 Units by mouth daily.   Yes [provider]  cetirizine (ZYRTEC) 10 MG tablet Take 1 tablet (10 mg total) by mouth daily. Patient not taking: Reported on 10/03/2016 05/01/14   Morrell RiddleWeber, Sarah L, PA-C  polyethylene glycol powder (GLYCOLAX/MIRALAX) powder Take 17 g by mouth 2 (two) times daily as needed. Patient not taking: Reported on 10/09/2017 04/16/17   Trena PlattEnglish, Stephanie D, PA  ranitidine (ZANTAC) 300 MG tablet Take 1 tablet (300 mg total) by mouth at bedtime. Patient not taking: Reported on 10/03/2016 05/01/14   Morrell RiddleWeber, Sarah L, PA-C    Family History Family History  Problem Relation Age of Onset  . Stroke Mother   . Hypertension Mother   . Heart disease Mother        Before age 69  . Heart attack Mother   . Stroke Father   . Diabetes Sister   . Hypertension Sister   . Hyperlipidemia Sister   . Hypertension Daughter   . Hypertension Sister   . Diabetes Sister     Social History Social History   Tobacco Use  . Smoking status: Never Smoker  . Smokeless tobacco: Never Used  Substance Use Topics  . Alcohol use: Yes    Comment: occ beer  . Drug use: No     Allergies  Patient has no known allergies.   Review of Systems Review of Systems  Musculoskeletal: Positive for back pain. Negative for arthralgias, gait problem, joint swelling, myalgias, neck pain and neck stiffness.  All other systems reviewed and are negative.    Physical Exam Updated Vital Signs BP (!) 154/86 (BP Location: Left Arm)   Pulse 85   Temp 97.9 F (36.6 C) (Oral)   Resp 17   SpO2 100%   Physical Exam Vitals signs and nursing note reviewed.  Constitutional:      Appearance: He is well-developed. He is not toxic-appearing.  HENT:     Head: Normocephalic and atraumatic.  Eyes:     General: No scleral icterus.       Right eye: No discharge.        Left  eye: No discharge.     Conjunctiva/sclera: Conjunctivae normal.  Neck:     Musculoskeletal: Normal range of motion.     Comments: Full ROM intact without spinous process TTP. No bony stepoffs or deformities, no paraspinous muscle TTP or muscle spasms. No rigidity or meningeal signs. No bruising, erythema, or swelling.  Cardiovascular:     Rate and Rhythm: Normal rate and regular rhythm.     Pulses: Normal pulses.          Radial pulses are 2+ on the right side and 2+ on the left side.     Heart sounds: Normal heart sounds.  Pulmonary:     Effort: Pulmonary effort is normal.     Breath sounds: Normal breath sounds.  Abdominal:     General: There is no distension.  Musculoskeletal: Normal range of motion.     Right lower leg: No edema.     Left lower leg: No edema.     Comments: Negative straight leg raise bilaterally.  No midline C, T, L, S spine tenderness.  Nontender to palpation over paraspinal muscles of lumbar spine.  No overlying rash, ecchymosis, or erythema.  Strength 5 out of 5 in bilateral knee flexion and extension.  No tenderness to all her joints including shoulders, elbows, wrists, hips.  Skin:    General: Skin is warm and dry.  Neurological:     Mental Status: He is oriented to person, place, and time.     Comments: Speech is clear and goal oriented, follows commands CN III-XII intact, no facial droop Normal strength in upper and lower extremities bilaterally including dorsiflexion and plantar flexion, strong and equal grip strength Sensation normal to light and sharp touch Moves extremities without ataxia, coordination intact Normal finger to nose and rapid alternating movements Normal gait and balance   Psychiatric:        Behavior: Behavior normal.      ED Treatments / Results  Labs (all labs ordered are listed, but only abnormal results are displayed) Labs Reviewed - No data to display  EKG None  Radiology Dg Lumbar Spine Complete  Result Date:  12/04/2018 CLINICAL DATA:  Low back pain radiating to left lower extremity EXAM: LUMBAR SPINE - COMPLETE 4+ VIEW COMPARISON:  None. FINDINGS: Multilevel degenerative disc disease with disc space narrowing and small osteophytes. Normal alignment. No acute compression fracture. IMPRESSION: Multilevel degenerative disc disease without acute abnormality. Electronically Signed   By: Deatra Robinson M.D.   On: 12/04/2018 19:19    Procedures Procedures (including critical care time)  Medications Ordered in ED Medications - No data to display   Initial Impression / Assessment and Plan / ED Course  I have reviewed the triage vital signs and the nursing notes.  Pertinent labs & imaging results that were available during my care of the patient were reviewed by me and considered in my medical decision making (see chart for details).    Patient with back pain.  No neurological deficits and normal neuro exam.  Patient can walk out pain.  No loss of bowel or bladder control.  No concern for cauda equina.  No fever, night sweats, weight loss, h/o cancer, IVDU.  Patient's age and new onset of back pain will order imaging. DDX includes muscle strain, sciatica less likely bone mets, pathologic fracture.  Xray reviewed by me and shows multilevel degenerative disc disease without acute abnormality.  Recommend patient continue taking meloxicam as prescribed. RICE protocol indicated and discussed with patient.   Patient is hemodynamically stable, in NAD, and able to ambulate in the ED. Evaluation does not show pathology that would require ongoing emergent intervention or inpatient treatment. I explained the diagnosis to the patient. Patient is comfortable with above plan and is stable for discharge at this time. All questions were answered prior to disposition. Strict return precautions for returning to the ED were discussed. Encouraged follow up with PCP. The patient was discussed with and seen by Dr. Rhunette Croft who agrees  with the treatment plan.  This note was prepared with assistance of Conservation officer, historic buildings. Occasional wrong-word or sound-a-like substitutions may have occurred due to the inherent limitations of voice recognition software.  Final Clinical Impressions(s) / ED Diagnoses   Final diagnoses:  Acute left-sided low back pain, unspecified whether sciatica present    ED Discharge Orders    None       Sherene Sires, PA-C 12/04/18 2347    Derwood Kaplan, MD 12/05/18 0111

## 2019-01-06 DIAGNOSIS — I10 Essential (primary) hypertension: Secondary | ICD-10-CM | POA: Diagnosis not present

## 2019-01-06 DIAGNOSIS — E785 Hyperlipidemia, unspecified: Secondary | ICD-10-CM | POA: Diagnosis not present

## 2019-01-06 DIAGNOSIS — M5416 Radiculopathy, lumbar region: Secondary | ICD-10-CM | POA: Diagnosis not present

## 2019-01-06 DIAGNOSIS — M1990 Unspecified osteoarthritis, unspecified site: Secondary | ICD-10-CM | POA: Diagnosis not present

## 2019-01-14 DIAGNOSIS — H2513 Age-related nuclear cataract, bilateral: Secondary | ICD-10-CM | POA: Diagnosis not present

## 2019-01-14 DIAGNOSIS — H353222 Exudative age-related macular degeneration, left eye, with inactive choroidal neovascularization: Secondary | ICD-10-CM | POA: Diagnosis not present

## 2019-01-14 DIAGNOSIS — H40013 Open angle with borderline findings, low risk, bilateral: Secondary | ICD-10-CM | POA: Diagnosis not present

## 2019-01-14 DIAGNOSIS — H25013 Cortical age-related cataract, bilateral: Secondary | ICD-10-CM | POA: Diagnosis not present

## 2019-02-17 DIAGNOSIS — I10 Essential (primary) hypertension: Secondary | ICD-10-CM | POA: Diagnosis not present

## 2019-02-17 DIAGNOSIS — M1990 Unspecified osteoarthritis, unspecified site: Secondary | ICD-10-CM | POA: Diagnosis not present

## 2019-02-17 DIAGNOSIS — E785 Hyperlipidemia, unspecified: Secondary | ICD-10-CM | POA: Diagnosis not present

## 2019-02-19 DIAGNOSIS — E785 Hyperlipidemia, unspecified: Secondary | ICD-10-CM | POA: Diagnosis not present

## 2019-02-19 DIAGNOSIS — I10 Essential (primary) hypertension: Secondary | ICD-10-CM | POA: Diagnosis not present

## 2019-02-19 DIAGNOSIS — M1990 Unspecified osteoarthritis, unspecified site: Secondary | ICD-10-CM | POA: Diagnosis not present

## 2019-04-07 DIAGNOSIS — M1990 Unspecified osteoarthritis, unspecified site: Secondary | ICD-10-CM | POA: Diagnosis not present

## 2019-04-07 DIAGNOSIS — I10 Essential (primary) hypertension: Secondary | ICD-10-CM | POA: Diagnosis not present

## 2019-04-07 DIAGNOSIS — E785 Hyperlipidemia, unspecified: Secondary | ICD-10-CM | POA: Diagnosis not present

## 2019-07-07 DIAGNOSIS — E785 Hyperlipidemia, unspecified: Secondary | ICD-10-CM | POA: Diagnosis not present

## 2019-07-07 DIAGNOSIS — R252 Cramp and spasm: Secondary | ICD-10-CM | POA: Diagnosis not present

## 2019-07-07 DIAGNOSIS — M1990 Unspecified osteoarthritis, unspecified site: Secondary | ICD-10-CM | POA: Diagnosis not present

## 2019-07-07 DIAGNOSIS — I10 Essential (primary) hypertension: Secondary | ICD-10-CM | POA: Diagnosis not present

## 2019-07-09 DIAGNOSIS — E785 Hyperlipidemia, unspecified: Secondary | ICD-10-CM | POA: Diagnosis not present

## 2019-07-09 DIAGNOSIS — R252 Cramp and spasm: Secondary | ICD-10-CM | POA: Diagnosis not present

## 2019-07-09 DIAGNOSIS — I10 Essential (primary) hypertension: Secondary | ICD-10-CM | POA: Diagnosis not present

## 2019-07-09 DIAGNOSIS — M1990 Unspecified osteoarthritis, unspecified site: Secondary | ICD-10-CM | POA: Diagnosis not present

## 2019-07-09 DIAGNOSIS — E559 Vitamin D deficiency, unspecified: Secondary | ICD-10-CM | POA: Diagnosis not present

## 2019-10-06 DIAGNOSIS — M1990 Unspecified osteoarthritis, unspecified site: Secondary | ICD-10-CM | POA: Diagnosis not present

## 2019-10-06 DIAGNOSIS — I679 Cerebrovascular disease, unspecified: Secondary | ICD-10-CM | POA: Diagnosis not present

## 2019-10-06 DIAGNOSIS — I10 Essential (primary) hypertension: Secondary | ICD-10-CM | POA: Diagnosis not present

## 2019-10-06 DIAGNOSIS — E785 Hyperlipidemia, unspecified: Secondary | ICD-10-CM | POA: Diagnosis not present

## 2019-10-06 DIAGNOSIS — E559 Vitamin D deficiency, unspecified: Secondary | ICD-10-CM | POA: Diagnosis not present

## 2019-11-29 ENCOUNTER — Ambulatory Visit: Payer: Medicare Other | Attending: Internal Medicine

## 2019-11-29 DIAGNOSIS — Z23 Encounter for immunization: Secondary | ICD-10-CM | POA: Insufficient documentation

## 2019-11-29 NOTE — Progress Notes (Signed)
   Covid-19 Vaccination Clinic  Name:  Roger [name]    MRN: [id number] DOB: Jun 10, 1951  11/29/2019  Roger [name] was observed post Covid-19 immunization for 15 minutes without incidence. He was provided with Vaccine Information Sheet and instruction to access the V-Safe system.   Roger [name] was instructed to call 911 with any severe reactions post vaccine: Marland Kitchen Difficulty breathing  . Swelling of your face and throat  . A fast heartbeat  . A bad rash all over your body  . Dizziness and weakness    Immunizations Administered    Name Date Dose VIS Date Route   Pfizer COVID-19 Vaccine 11/29/2019  9:20 AM 0.3 mL 09/10/2019 Intramuscular   Manufacturer: [employer], [employer]   Lot: [id number]   NDC: [address]-1

## 2019-12-28 ENCOUNTER — Ambulatory Visit: Payer: Medicare Other | Attending: Internal Medicine

## 2019-12-28 DIAGNOSIS — Z23 Encounter for immunization: Secondary | ICD-10-CM

## 2019-12-28 NOTE — Progress Notes (Signed)
   Covid-19 Vaccination Clinic  Name:  Roger [name]    MRN: [id number] DOB: [birth year]  12/28/2019  Roger [name] was observed post Covid-19 immunization for 15 minutes without incident. He was provided with Vaccine Information Sheet and instruction to access the V-Safe system.   Roger [name] was instructed to call 911 with any severe reactions post vaccine: Marland Kitchen Difficulty breathing  . Swelling of face and throat  . A fast heartbeat  . A bad rash all over body  . Dizziness and weakness   Immunizations Administered    Name Date Dose VIS Date Route   Pfizer COVID-19 Vaccine 12/28/2019  9:00 AM 0.3 mL 09/10/2019 Intramuscular   Manufacturer: [employer], [employer]   Lot: [id number]   NDC: [address]-9

## 2020-01-05 DIAGNOSIS — I679 Cerebrovascular disease, unspecified: Secondary | ICD-10-CM | POA: Diagnosis not present

## 2020-01-05 DIAGNOSIS — I10 Essential (primary) hypertension: Secondary | ICD-10-CM | POA: Diagnosis not present

## 2020-01-05 DIAGNOSIS — E559 Vitamin D deficiency, unspecified: Secondary | ICD-10-CM | POA: Diagnosis not present

## 2020-01-05 DIAGNOSIS — M1990 Unspecified osteoarthritis, unspecified site: Secondary | ICD-10-CM | POA: Diagnosis not present

## 2020-01-05 DIAGNOSIS — E785 Hyperlipidemia, unspecified: Secondary | ICD-10-CM | POA: Diagnosis not present

## 2020-01-11 DIAGNOSIS — I10 Essential (primary) hypertension: Secondary | ICD-10-CM | POA: Diagnosis not present

## 2020-01-11 DIAGNOSIS — E559 Vitamin D deficiency, unspecified: Secondary | ICD-10-CM | POA: Diagnosis not present

## 2020-01-11 DIAGNOSIS — I679 Cerebrovascular disease, unspecified: Secondary | ICD-10-CM | POA: Diagnosis not present

## 2020-01-11 DIAGNOSIS — E785 Hyperlipidemia, unspecified: Secondary | ICD-10-CM | POA: Diagnosis not present

## 2020-01-19 ENCOUNTER — Telehealth: Payer: Self-pay

## 2020-01-19 NOTE — Telephone Encounter (Signed)
Pt called triage line. Did not state any question/concern. Asked for a call back. I have returned his call and left a vm to call us if he still needs [place].

## 2020-01-26 ENCOUNTER — Telehealth: Payer: Self-pay

## 2020-01-26 NOTE — Telephone Encounter (Signed)
Pt called to inquire about an [employer] advertisement that he received regarding labs/studies. He was wondering if it would be worthwhile for him. We reviewed his plan of care from his last appt and I explained that from our perspective he needs a f/u carotid duplex next year. I encouraged him to reach out to his insurance provider and PCP to seek their advice on cost/benefits of this advertisement. Pt verbalized understanding.

## 2020-01-31 DIAGNOSIS — H25013 Cortical age-related cataract, bilateral: Secondary | ICD-10-CM | POA: Diagnosis not present

## 2020-01-31 DIAGNOSIS — H353222 Exudative age-related macular degeneration, left eye, with inactive choroidal neovascularization: Secondary | ICD-10-CM | POA: Diagnosis not present

## 2020-01-31 DIAGNOSIS — H40013 Open angle with borderline findings, low risk, bilateral: Secondary | ICD-10-CM | POA: Diagnosis not present

## 2020-01-31 DIAGNOSIS — H2513 Age-related nuclear cataract, bilateral: Secondary | ICD-10-CM | POA: Diagnosis not present

## 2020-03-18 IMAGING — CR DG LUMBAR SPINE COMPLETE 4+V
5 series · 5 of 5 positions shown · non-contrast
Comparison: None.

CLINICAL DATA: Low back pain radiating to left lower extremity

EXAM:
LUMBAR SPINE - COMPLETE 4+ VIEW

[t lumbar spine ap]
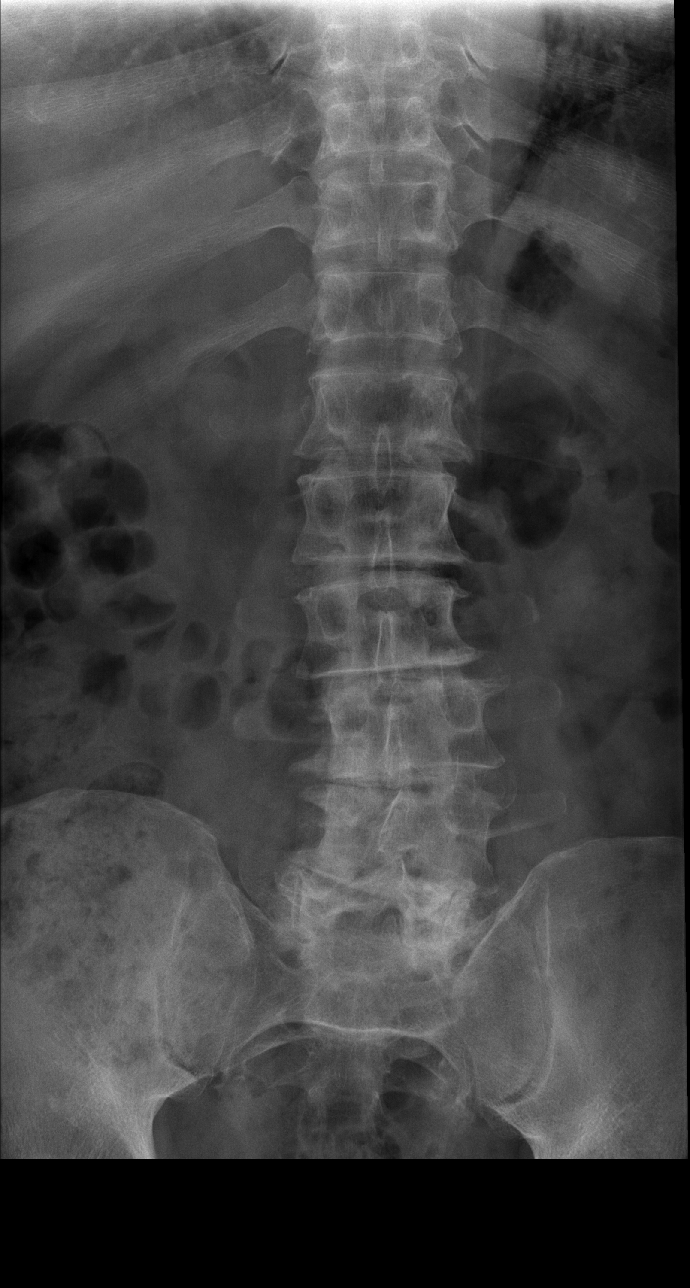

[t lumbar spine obl]
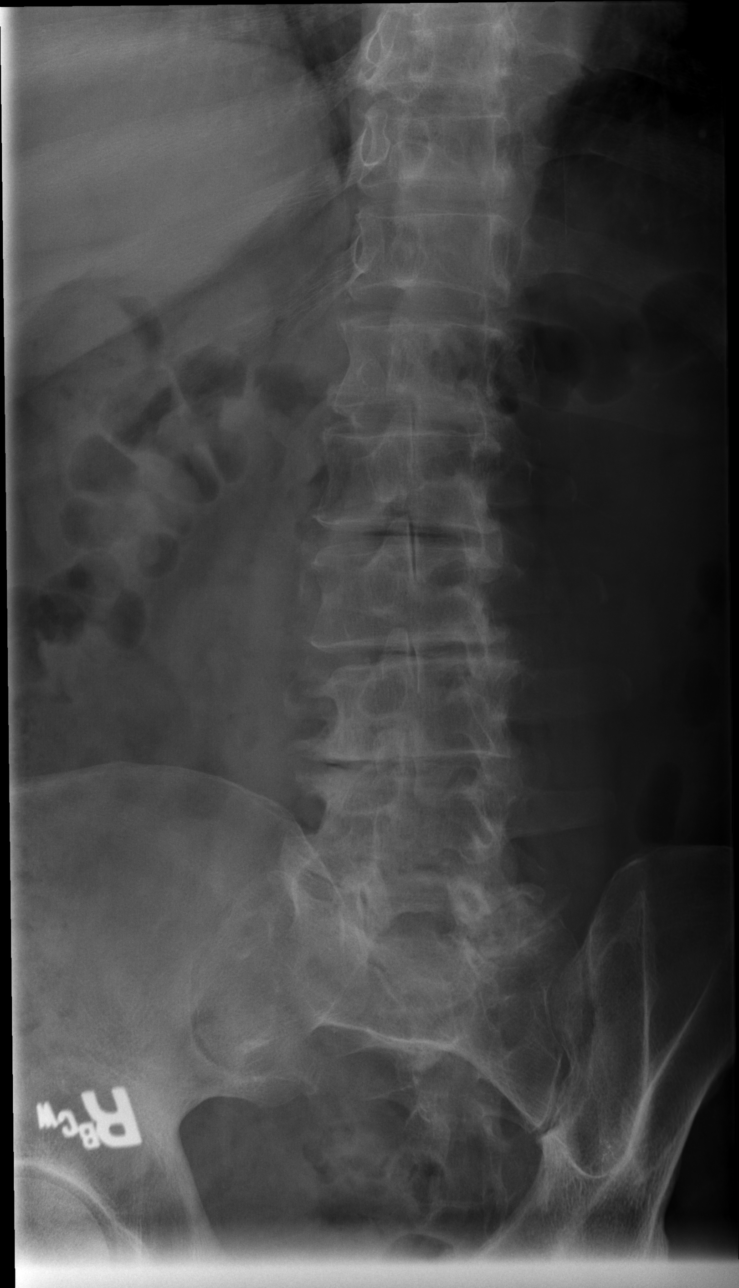

[t lumbar spine lat (1 of 2)]
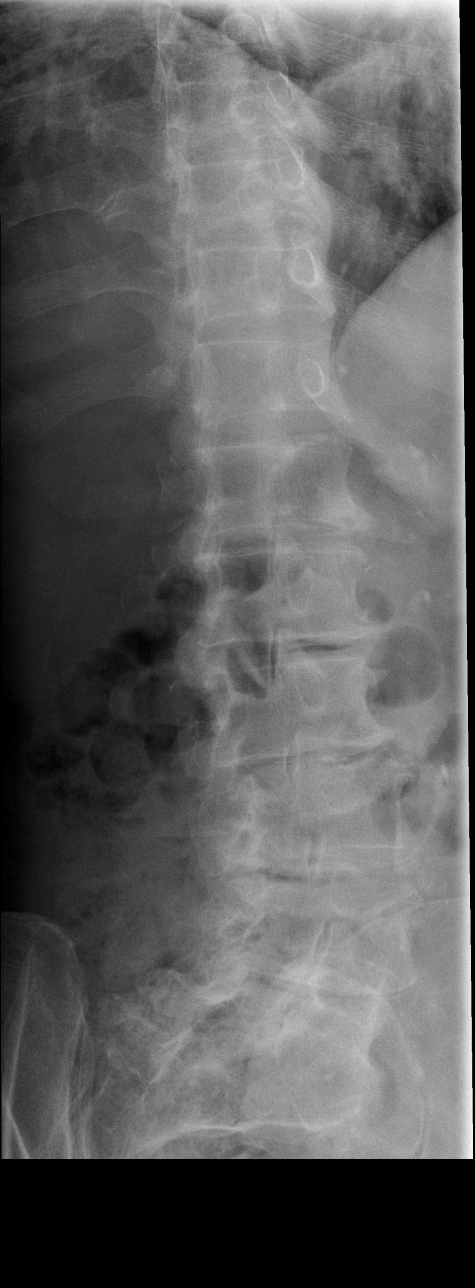

[t lumbar spine lat (2 of 2)]
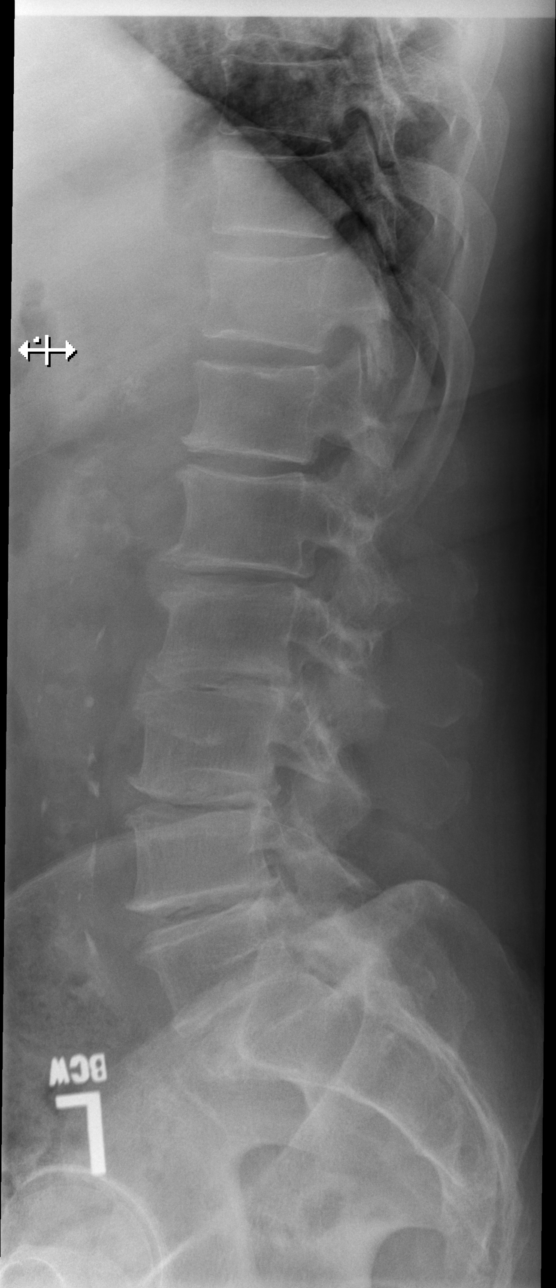

[t lumbar l-5 s-1 spot]
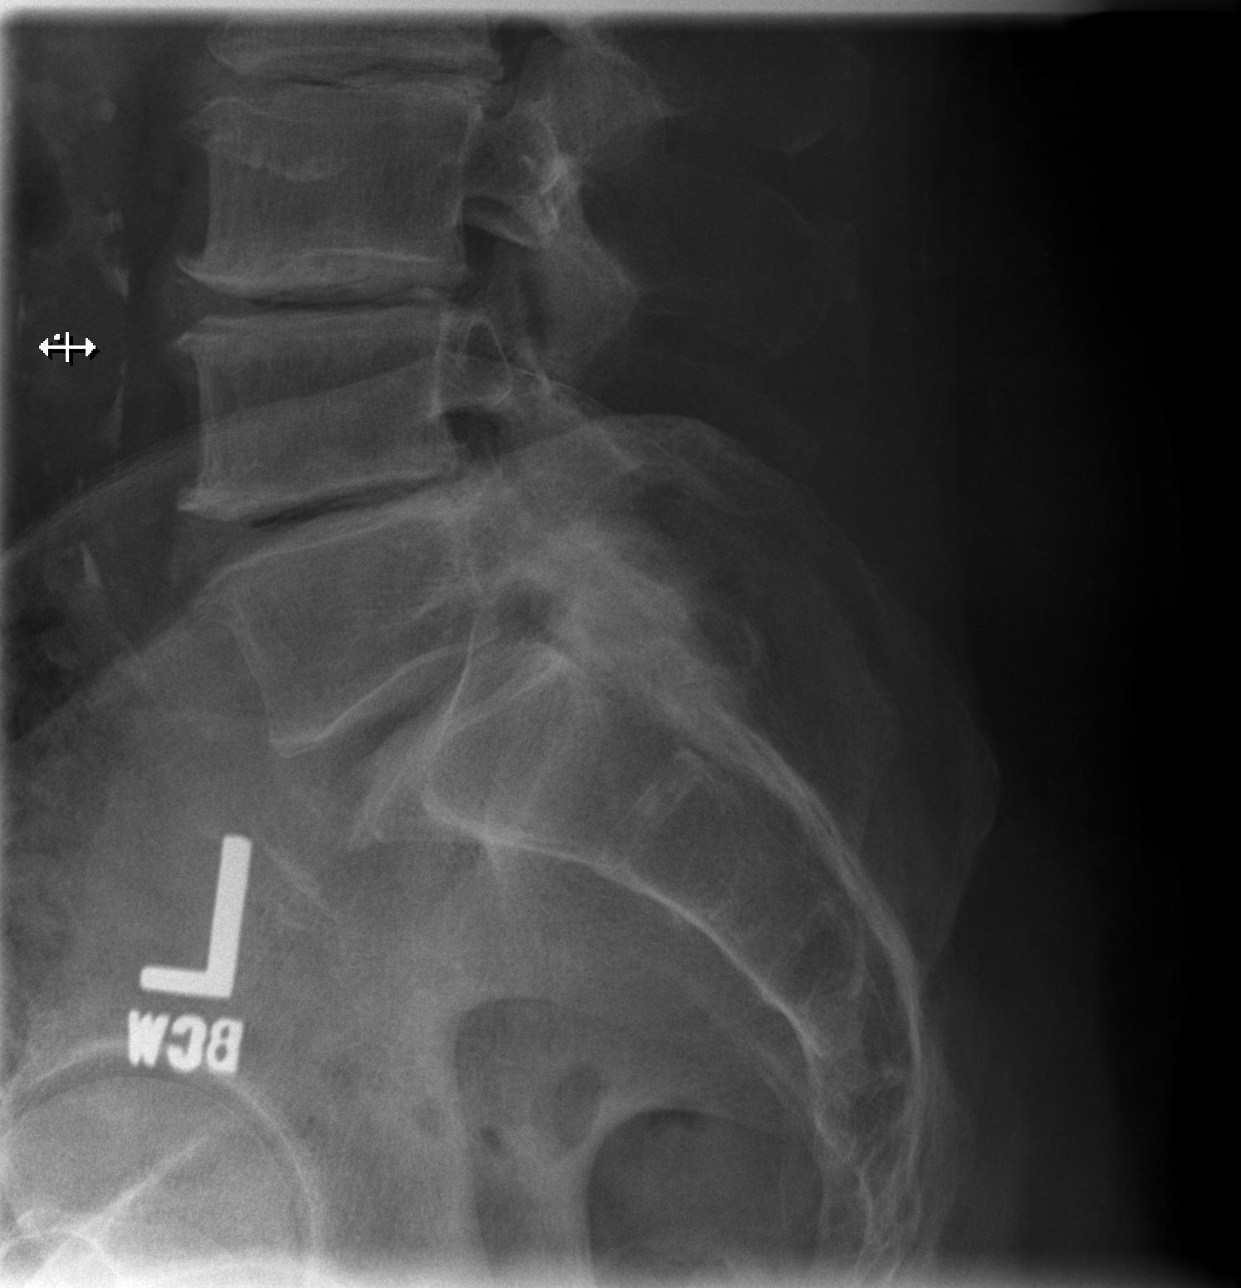

[5 of 5 positions shown; findings below may reference images not displayed]

FINDINGS: Multilevel degenerative disc disease with disc space narrowing and
small osteophytes. Normal alignment. No acute compression fracture.
IMPRESSION: Multilevel degenerative disc disease without acute abnormality.

## 2020-06-28 DIAGNOSIS — I679 Cerebrovascular disease, unspecified: Secondary | ICD-10-CM | POA: Diagnosis not present

## 2020-06-28 DIAGNOSIS — E785 Hyperlipidemia, unspecified: Secondary | ICD-10-CM | POA: Diagnosis not present

## 2020-06-28 DIAGNOSIS — I10 Essential (primary) hypertension: Secondary | ICD-10-CM | POA: Diagnosis not present

## 2020-09-27 DIAGNOSIS — I679 Cerebrovascular disease, unspecified: Secondary | ICD-10-CM | POA: Diagnosis not present

## 2020-09-27 DIAGNOSIS — E559 Vitamin D deficiency, unspecified: Secondary | ICD-10-CM | POA: Diagnosis not present

## 2020-09-27 DIAGNOSIS — I10 Essential (primary) hypertension: Secondary | ICD-10-CM | POA: Diagnosis not present

## 2020-09-27 DIAGNOSIS — E785 Hyperlipidemia, unspecified: Secondary | ICD-10-CM | POA: Diagnosis not present

## 2020-09-28 ENCOUNTER — Other Ambulatory Visit: Payer: Self-pay

## 2020-09-28 DIAGNOSIS — I6523 Occlusion and stenosis of bilateral carotid arteries: Secondary | ICD-10-CM

## 2020-10-17 ENCOUNTER — Other Ambulatory Visit: Payer: Self-pay

## 2020-10-17 ENCOUNTER — Ambulatory Visit (INDEPENDENT_AMBULATORY_CARE_PROVIDER_SITE_OTHER): Payer: Medicare Other | Admitting: Physician Assistant

## 2020-10-17 ENCOUNTER — Ambulatory Visit (HOSPITAL_COMMUNITY)
Admission: RE | Admit: 2020-10-17 | Discharge: 2020-10-17 | Disposition: A | Discharge status: Home / Self Care | Payer: Medicare Other | Source: Ambulatory Visit | Attending: Physician Assistant | Admitting: Physician Assistant

## 2020-10-17 VITALS — BP 120/68 | HR 75 | Temp 98.8°F | Resp 20 | Ht 69.0 in | Wt 201.8 lb

## 2020-10-17 DIAGNOSIS — I6523 Occlusion and stenosis of bilateral carotid arteries: Secondary | ICD-10-CM

## 2020-10-17 NOTE — Progress Notes (Signed)
Office Note     CC:  follow up Requesting Provider:  Rinaldo Cloud, MD  HPI: Roger [name] is a 70 y.o. ([birth year]) male who presents for surveillance follow up of mild carotid artery disease. He was last seen in January of 2020. At which time he was doing very well with no neurological symptoms. He has no history of TIA or Stroke. No previous carotid intervention  Today he explains that he is doing well. He denies any amaurosis fugax or monocular blindness, slurred speech, facial drooping, weakness or numbness of his upper or lower extremities  The pt is on a statin for cholesterol management.  The pt is on a daily aspirin.   Other AC: Plavix The pt is on Amlodipine- Atorvastatin combo and ARB for hypertension.   The pt is not diabetic.  Tobacco hx:  Never smoker  Past Medical History:  Diagnosis Date  . Carotid artery occlusion   . Hyperlipidemia   . Hypertension     No past surgical history on file.  Social History   Socioeconomic History  . Marital status: Married    Spouse name: Not on file  . Number of children: Not on file  . Years of education: Not on file  . Highest education level: Not on file  Occupational History  . Not on file  Tobacco Use  . Smoking status: Never Smoker  . Smokeless tobacco: Never Used  Substance and Sexual Activity  . Alcohol use: Yes    Comment: occ beer  . Drug use: No  . Sexual activity: Not on file  Other Topics Concern  . Not on file  Social History Narrative  . Not on file   Social Determinants of Health   Financial Resource Strain: Not on file  Food Insecurity: Not on file  Transportation Needs: Not on file  Physical Activity: Not on file  Stress: Not on file  Social Connections: Not on file  Intimate Partner Violence: Not on file    Family History  Problem Relation Age of Onset  . Stroke Mother   . Hypertension Mother   . Heart disease Mother        Before age 50  . Heart attack Mother   . Stroke Father   .  Diabetes Sister   . Hypertension Sister   . Hyperlipidemia Sister   . Hypertension Daughter   . Hypertension Sister   . Diabetes Sister     Current Outpatient Medications  Medication Sig Dispense Refill  . amlodipine-atorvastatin (CADUET) 10-20 MG per tablet     . aspirin 81 MG tablet Take 81 mg by mouth daily.    . Garlic 10 MG CAPS Take by mouth.    . losartan (COZAAR) 100 MG tablet Take 100 mg by mouth daily.     . Multiple Vitamin (MULTIVITAMIN) tablet Take 1 tablet by mouth daily.    Marland Kitchen olmesartan (BENICAR) 20 MG tablet Take 20 mg by mouth daily.    . Vitamin D, Ergocalciferol, (DRISDOL) 1.25 MG (50000 UNIT) CAPS capsule Take 50,000 Units by mouth once a week.    . vitamin E 180 MG (400 UNITS) capsule Take 400 Units by mouth daily.     No current facility-administered medications for this visit.    No Known Allergies   REVIEW OF SYSTEMS:  [X]  denotes positive finding, [ ]  denotes negative finding Cardiac  Comments:  Chest pain or chest pressure:    Shortness of breath upon exertion:    Short  of breath when lying flat:    Irregular heart rhythm:        Vascular    Pain in calf, thigh, or hip brought on by ambulation:    Pain in feet at night that wakes you up from your sleep:     Blood clot in your veins:    Leg swelling:         Pulmonary    Oxygen at home:    Productive cough:     Wheezing:         Neurologic    Sudden weakness in arms or legs:     Sudden numbness in arms or legs:     Sudden onset of difficulty speaking or slurred speech:    Temporary loss of vision in one eye:     Problems with dizziness:         Gastrointestinal    Blood in stool:     Vomited blood:         Genitourinary    Burning when urinating:     Blood in urine:        Psychiatric    Major depression:         Hematologic    Bleeding problems:    Problems with blood clotting too easily:        Skin    Rashes or ulcers:        Constitutional    Fever or chills:       PHYSICAL EXAMINATION:  Vitals:   10/17/20 1027 10/17/20 1029  BP: 128/66 120/68  Pulse: 75   Resp: 20   Temp: 98.8 F (37.1 C)   TempSrc: Temporal   SpO2: 98%   Weight: 201 lb 12.8 oz (91.5 kg)   Height: 5\' 9"  (1.753 m)     General:  WDWN in NAD; vital signs documented above Gait: Normal HENT: WNL, normocephalic Pulmonary: normal non-labored breathing , without wheezing Cardiac: regular HR, without  Murmurs without carotid bruit Vascular Exam/Pulses:  Right Left  Radial 2+ (normal) 2+ (normal)  Femoral 2+ (normal) 2+ (normal)  Popliteal Not palpabe Not palpable  DP 2+ (normal) 2+ (normal)  PT 2+ (normal) 2+ (normal)   Extremities: without ischemic changes, without Gangrene , without cellulitis; without open wounds;  Musculoskeletal: no muscle wasting or atrophy  Neurologic: A&O X 3;  No focal weakness or paresthesias are detected Psychiatric:  The pt has Normal affect.   Non-Invasive Vascular Imaging:   Right Carotid: Velocities in the right ICA are consistent with a 1-39% stenosis. The end diastolic velocity is increased since the previous exam and is nearing the threshold for 40-59% stenosis (40 cm/s).   Left Carotid: Velocities in the left ICA are consistent with a 1-39% stenosis. The end diastolic velocity is increased since the previous exam and is nearing the threshold for 40-59% stenosis (40 cm/s).   Vertebrals: Bilateral vertebral arteries demonstrate antegrade flow.  Subclavians: Normal flow hemodynamics were seen in bilateral subclavian arteries.    ASSESSMENT/PLAN:: 70 y.o. male here for follow up for mild carotid artery disease. He remains without symptoms. His duplex today shows relatively stable 1-39% stenosis bilaterally. He does have some increased velocities in his ECA and subclavian arteries bilaterally but otherwise stable exam. - he remains on Statin and Aspirin - Blood pressure is well controlled - reviewed TIA/ Stroke like symptoms with  patient and he knows should these occur he will seek immediate medical attention -He will follow up in 2 years  with carotid duplex   Graceann Congress, PA-C [facility] [phone]  Clinic MD:  Dr. Chestine Spore

## 2021-06-26 ENCOUNTER — Other Ambulatory Visit: Payer: Self-pay

## 2021-06-26 ENCOUNTER — Emergency Department (HOSPITAL_BASED_OUTPATIENT_CLINIC_OR_DEPARTMENT_OTHER)
Admission: EM | Admit: 2021-06-26 | Discharge: 2021-06-26 | Disposition: A | Discharge status: Home / Self Care | Payer: Medicare Other | Attending: Emergency Medicine | Admitting: Emergency Medicine

## 2021-06-26 ENCOUNTER — Encounter (HOSPITAL_BASED_OUTPATIENT_CLINIC_OR_DEPARTMENT_OTHER): Payer: Self-pay

## 2021-06-26 DIAGNOSIS — I10 Essential (primary) hypertension: Secondary | ICD-10-CM | POA: Diagnosis not present

## 2021-06-26 DIAGNOSIS — M5441 Lumbago with sciatica, right side: Secondary | ICD-10-CM | POA: Diagnosis not present

## 2021-06-26 DIAGNOSIS — M5442 Lumbago with sciatica, left side: Secondary | ICD-10-CM | POA: Insufficient documentation

## 2021-06-26 DIAGNOSIS — Z79899 Other long term (current) drug therapy: Secondary | ICD-10-CM | POA: Diagnosis not present

## 2021-06-26 DIAGNOSIS — M79604 Pain in right leg: Secondary | ICD-10-CM | POA: Diagnosis not present

## 2021-06-26 DIAGNOSIS — M79605 Pain in left leg: Secondary | ICD-10-CM | POA: Insufficient documentation

## 2021-06-26 DIAGNOSIS — M545 Low back pain, unspecified: Secondary | ICD-10-CM | POA: Diagnosis present

## 2021-06-26 DIAGNOSIS — M5432 Sciatica, left side: Secondary | ICD-10-CM

## 2021-06-26 DIAGNOSIS — Z7982 Long term (current) use of aspirin: Secondary | ICD-10-CM | POA: Insufficient documentation

## 2021-06-26 DIAGNOSIS — M5431 Sciatica, right side: Secondary | ICD-10-CM

## 2021-06-26 MED ORDER — PREDNISONE 10 MG PO TABS: 20.0000 mg | ORAL_TABLET | Freq: Every day | ORAL | 0 refills | Status: AC

## 2021-06-26 NOTE — ED Provider Notes (Signed)
[facility] EMERGENCY DEPARTMENT Provider Note   CSN: [id number] Arrival date & time: 06/26/21  1116     History Chief Complaint  Patient presents with   Back Pain    Roger [name] is a 70 y.o. male.  Roger [name] is a 70 year old male who presents today for evaluation of back pain and bilateral radiating leg pain.  This is a chronic issue with duration of multiple years but current episode has been going on x3 weeks which includes the radiating pain down bilateral legs.  He reports he was seen by his cardiologist last week who is his only medical provider, and was referred to the emergency room for further work-up.  Currently he is resting without acute distress and denies pain.  He reports his pain worsens with significant amount of activity.  He denies any recent trauma, unintentional weight loss, IV drug use history, loss of bowel or bladder control, saddle anesthesia, or fever/chills.  Reports some relief with taking Tylenol every other day and occasional Aleve.  The history is provided by the patient.  Back Pain Location:  Lumbar spine Quality:  Shooting Radiates to:  L posterior upper leg and R posterior upper leg Pain severity:  Moderate Pain is:  Unable to specify Onset quality:  Unable to specify Duration:  3 weeks Timing:  Intermittent Progression:  Unchanged Chronicity:  Chronic Context: not falling, not lifting heavy objects, not recent illness and not recent injury   Relieved by:  NSAIDs and OTC medications Associated symptoms: no abdominal pain, no bladder incontinence, no bowel incontinence, no dysuria, no fever, no weakness and no weight loss       Past Medical History:  Diagnosis Date   Carotid artery occlusion    Hyperlipidemia    Hypertension     Patient Active Problem List   Diagnosis Date Noted   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 No pertinent surgical history.     Family History  Problem Relation Age of Onset   Stroke Mother    Hypertension Mother    Heart disease Mother        Before age 35   Heart attack Mother    Stroke Father    Diabetes Sister    Hypertension Sister    Hyperlipidemia Sister    Hypertension Daughter    Hypertension Sister    Diabetes Sister     Social History   Tobacco Use   Smoking status: Never   Smokeless tobacco: Never  Substance Use Topics   Alcohol use: Yes    Comment: occ beer   Drug use: No    Home Medications Prior to Admission medications   Medication Sig Start Date End Date Taking? Authorizing Provider  amlodipine-atorvastatin (CADUET) 10-20 MG per tablet  06/22/12   [provider]  aspirin 81 MG tablet Take 81 mg by mouth daily.    [provider]  Garlic 10 MG CAPS Take by mouth.    [provider]  losartan (COZAAR) 100 MG tablet Take 100 mg by mouth daily.  08/03/12   [provider]  Multiple Vitamin (MULTIVITAMIN) tablet Take 1 tablet by mouth daily.    [provider]  olmesartan (BENICAR) 20 MG tablet Take 20 mg by mouth daily. 09/28/20   [provider]  Vitamin D, Ergocalciferol, (DRISDOL) 1.25 MG (50000 UNIT) CAPS capsule  Take 50,000 Units by mouth once a week. 10/16/20   [provider]  vitamin E 180 MG (400 UNITS) capsule Take 400 Units by mouth daily.    [provider]    Allergies    Patient has no known allergies.  Review of Systems   Review of Systems  Constitutional:  Negative for activity change, fever and weight loss.  Respiratory:  Negative for shortness of breath.   Gastrointestinal:  Negative for abdominal pain and bowel incontinence.  Genitourinary:  Negative for bladder incontinence and dysuria.  Musculoskeletal:  Positive for back pain. Negative for gait problem and myalgias.  Neurological:  Negative for weakness.  All other  systems reviewed and are negative.  Physical Exam Updated Vital Signs BP 123/64 (BP Location: Left Arm)   Pulse 79   Temp 98.4 F (36.9 C) (Oral)   Resp 20   Ht 5\' 9"  (1.753 m)   Wt 91.2 kg   SpO2 99%   BMI 29.68 kg/m   Physical Exam Vitals and nursing note reviewed.  Constitutional:      General: He is not in acute distress.    Appearance: Normal appearance. He is not ill-appearing.  HENT:     Head: Normocephalic and atraumatic.  Cardiovascular:     Rate and Rhythm: Normal rate and regular rhythm.     Pulses:          Dorsalis pedis pulses are 2+ on the right side and 2+ on the left side.  Pulmonary:     Effort: Pulmonary effort is normal. No respiratory distress.     Breath sounds: Normal breath sounds. No wheezing or rales.  Abdominal:     General: There is no distension.     Tenderness: There is no abdominal tenderness. There is no guarding.  Musculoskeletal:        General: No tenderness. Normal range of motion.     Cervical back: Normal.     Thoracic back: Normal.     Lumbar back: Normal. No spasms or tenderness. Negative right straight leg raise test and negative left straight leg raise test.     Right lower leg: No edema.     Left lower leg: No edema.     Comments: No bony tenderness over cervical, thoracic, lumbar spine.  Negative straight leg raise test bilaterally.  No rash, ecchymosis or bruising present on back.  Paraspinal muscles nontender.  5/5 strength in bilateral hip, knees, and ankle.  Sensation intact.  2+ dorsalis pedis pulse present bilaterally.  Neurological:     General: No focal deficit present.     Mental Status: He is alert and oriented to person, place, and time.     Sensory: Sensation is intact.     Motor: Motor function is intact. No weakness.    ED Results / Procedures / Treatments   Labs (all labs ordered are listed, but only abnormal results are displayed) Labs Reviewed - No data to display  EKG None  Radiology No results  found.  Procedures Procedures   Medications Ordered in ED Medications - No data to display  ED Course  I have reviewed the triage vital signs and the nursing notes.  Pertinent labs & imaging results that were available during my care of the patient were reviewed by me and considered in my medical decision making (see chart for details).    MDM Rules/Calculators/A&P  70 year old male presenting with back pain and bilateral lower leg pain.  He is currently back pain-free.  He is without any red flag signs or symptoms.  We will give patient 5-day course of prednisone for anti-inflammatory effect.  Will refer patient to Dr. Jordan Likes.  Discussed return precautions.  Patient appropriate for discharge.  Final Clinical Impression(s) / ED Diagnoses Final diagnoses:  Bilateral sciatica    Rx / DC Orders ED Discharge Orders     None        Marita Kansas, PA-C 06/26/21 1421    Milagros Loll, MD 06/28/21 1044

## 2021-06-26 NOTE — Discharge Instructions (Addendum)
Complete the prednisone course as prescribed.  Follow-up with Dr. Jordan Likes as discussed.  Follow-up in the emergency room if pain worsens.

## 2021-06-26 NOTE — ED Triage Notes (Signed)
Pt c/o lower back pain x "years" worse with pain radiating down both legs x 1-2 weeks-NAD-steady gait

## 2021-07-03 ENCOUNTER — Ambulatory Visit (INDEPENDENT_AMBULATORY_CARE_PROVIDER_SITE_OTHER): Payer: Medicare Other | Admitting: Family Medicine

## 2021-07-03 ENCOUNTER — Encounter: Payer: Self-pay | Admitting: Family Medicine

## 2021-07-03 VITALS — Ht 69.0 in | Wt 201.0 lb

## 2021-07-03 DIAGNOSIS — M19032 Primary osteoarthritis, left wrist: Secondary | ICD-10-CM

## 2021-07-03 DIAGNOSIS — M48062 Spinal stenosis, lumbar region with neurogenic claudication: Secondary | ICD-10-CM | POA: Insufficient documentation

## 2021-07-03 MED ORDER — DICLOFENAC SODIUM 1 % EX GEL: 4.0000 g | Freq: Four times a day (QID) | CUTANEOUS | 11 refills | Status: AC

## 2021-07-03 MED ORDER — GABAPENTIN 100 MG PO CAPS: 100.0000 mg | ORAL_CAPSULE | Freq: Three times a day (TID) | ORAL | 1 refills | Status: DC

## 2021-07-03 NOTE — Progress Notes (Signed)
  Roger [name] - 70 y.o. male MRN [id number]  Date of birth: [birth year]  SUBJECTIVE:  Including CC & ROS.  No chief complaint on file.   Roger [name] is a 70 y.o. male that is presenting with altered sensation in each leg when he walks as well as left wrist and hand pain and left toe pain.  He gets these sensations in each leg when he walks.  He will rest for period of time and the fatigue improves.  He did have some pain in the lower back as well.  Has pain in the dorsal aspect of the hand and wrist.  Has pain in the second MTP joint.  Independent review of the lumbar spine x-ray from 2020 shows degenerative disc disease with facet arthrosis.   Review of Systems See HPI   HISTORY: Past Medical, Surgical, Social, and Family History Reviewed & Updated per EMR.   Pertinent Historical Findings include:  Past Medical History:  Diagnosis Date   Carotid artery occlusion    Hyperlipidemia    Hypertension     History reviewed. No pertinent surgical history.  Family History  Problem Relation Age of Onset   Stroke Mother    Hypertension Mother    Heart disease Mother        Before age 81   Heart attack Mother    Stroke Father    Diabetes Sister    Hypertension Sister    Hyperlipidemia Sister    Hypertension Daughter    Hypertension Sister    Diabetes Sister     Social History   Socioeconomic History   Marital status: Married    Spouse name: Not on file   Number of children: Not on file   Years of education: Not on file   Highest education level: Not on file  Occupational History   Not on file  Tobacco Use   Smoking status: Never   Smokeless tobacco: Never  Substance and Sexual Activity   Alcohol use: Yes    Comment: occ beer   Drug use: No   Sexual activity: Not on file  Other Topics Concern   Not on file  Social History Narrative   Not on file   Social Determinants of Health   Financial Resource Strain: Not on file  Food Insecurity: Not on file   Transportation Needs: Not on file  Physical Activity: Not on file  Stress: Not on file  Social Connections: Not on file  Intimate Partner Violence: Not on file     PHYSICAL EXAM:  VS: Ht 5\' 9"  (1.753 m)   Wt 201 lb (91.2 kg)   BMI 29.68 kg/m  Physical Exam Gen: NAD, alert, cooperative with exam, well-appearing       ASSESSMENT & PLAN:   Lumbar stenosis with neurogenic claudication Symptoms seem more associated with the neurogenic claudication as opposed to being degenerative changes at his back.  Have been acutely occurring -Counseled on home exercise therapy and supportive care. -Referral to physical therapy. -Gabapentin. -Could consider further imaging.  Primary osteoarthritis of left wrist Has degenerative changes that are in and around the wrist.  Likely related to his pain. -Counseled on home exercise therapy and supportive care. -Voltaren. -Could consider physical therapy.

## 2021-07-03 NOTE — Assessment & Plan Note (Signed)
Symptoms seem more associated with the neurogenic claudication as opposed to being degenerative changes at his back.  Have been acutely occurring -Counseled on home exercise therapy and supportive care. -Referral to physical therapy. -Gabapentin. -Could consider further imaging.

## 2021-07-03 NOTE — Patient Instructions (Signed)
Nice to meet you Please try physical therapy  The voltaren can be rubbed on the hand and toe  Please start with one pill of gabapentin at night. You can increase this to 2 or 3 times daily as you tolerate   Please send me a message in MyChart with any questions or updates.  Please see me back in 4 weeks.   --Dr. Jordan Likes

## 2021-07-03 NOTE — Assessment & Plan Note (Signed)
Has degenerative changes that are in and around the wrist.  Likely related to his pain. -Counseled on home exercise therapy and supportive care. -Voltaren. -Could consider physical therapy.

## 2021-07-05 ENCOUNTER — Telehealth: Payer: Self-pay | Admitting: *Deleted

## 2021-07-05 NOTE — Telephone Encounter (Signed)
Diclofenac PA initiated and approved via CoverMyMeds 07/05/2021 to 07/04/2024.

## 2021-07-17 ENCOUNTER — Ambulatory Visit: Payer: Medicare Other | Attending: Family Medicine | Admitting: Physical Therapy

## 2021-07-17 ENCOUNTER — Other Ambulatory Visit: Payer: Self-pay

## 2021-07-17 DIAGNOSIS — M6281 Muscle weakness (generalized): Secondary | ICD-10-CM | POA: Insufficient documentation

## 2021-07-17 DIAGNOSIS — R262 Difficulty in walking, not elsewhere classified: Secondary | ICD-10-CM | POA: Insufficient documentation

## 2021-07-17 DIAGNOSIS — G8929 Other chronic pain: Secondary | ICD-10-CM | POA: Insufficient documentation

## 2021-07-17 DIAGNOSIS — M5442 Lumbago with sciatica, left side: Secondary | ICD-10-CM | POA: Insufficient documentation

## 2021-07-17 DIAGNOSIS — R293 Abnormal posture: Secondary | ICD-10-CM | POA: Diagnosis present

## 2021-07-17 DIAGNOSIS — M5416 Radiculopathy, lumbar region: Secondary | ICD-10-CM | POA: Insufficient documentation

## 2021-07-17 NOTE — Therapy (Signed)
[facility] [address] [place], [place], [address] Phone: [phone]   Fax:  [phone]  Physical Therapy Evaluation  Patient Details  Name: Roger [name] MRN: [id number] Date of Birth: Feb 23, 1951 Referring Provider (PT): Myra Rude, MD   Encounter Date: 07/17/2021   PT End of Session - 07/17/21 1020     Visit Number 1    Number of Visits 13    Date for PT Re-Evaluation 08/28/21    Authorization Type Medicare    Progress Note Due on Visit 10    PT Start Time 0930    PT Stop Time 1020    PT Time Calculation (min) 50 min    Activity Tolerance Patient tolerated treatment well    Behavior During Therapy [facility] for tasks assessed/performed             Past Medical History:  Diagnosis Date   Carotid artery occlusion    Hyperlipidemia    Hypertension     No past surgical history on file.  There were no vitals filed for this visit.    Subjective Assessment - 07/17/21 0933     Subjective Pt reports history of deteriorating discs. In the 2000s it was just in the back but now it is going down both legs. "I can't stand or walk a long time." "Sometimes I have to sit down and rest". Problems with sleeping at night. L leg he also gets cramps (uses theraworx to spray his left leg which helps).    Limitations Standing;Walking;House hold activities    How long can you sit comfortably? Sitting in high chair he can feel it going down his legs    How long can you stand comfortably? <5 minutes when washing dishes    How long can you walk comfortably? 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 Yes    Pain Score 2     Pain Location Leg    Pain Orientation Left;Right    Pain Descriptors / Indicators Aching;Dull    Pain Type Chronic pain    Pain Radiating Towards bilat LEs    Pain Onset More than a month ago    Pain  Frequency Constant    Aggravating Factors  Increased time in standing    Pain Relieving Factors heat, gels    Effect of Pain on Daily Activities Difficulty walking for leisure                [facility] PT Assessment - 07/17/21 0001       Assessment   Medical Diagnosis M48.062 (ICD-10-CM) - Lumbar stenosis with neurogenic claudication  M19.032 (ICD-10-CM) - Primary osteoarthritis of left wrist    Referring Provider (PT) Myra Rude, MD    Onset Date/Surgical Date --   since 2000s   Prior Therapy None; but has seen chiropractor      Precautions   Precautions None      Restrictions   Weight Bearing Restrictions No      Balance Screen   Has the patient fallen in the past 6 months No      Home Environment   Living Environment Private residence    Living Arrangements Spouse/significant other    Home Access Stairs to enter   No issues with back during that   Entrance Stairs-Number of Steps 5    Entrance  Stairs-Rails Can reach both      Prior Function   Vocation Retired    Leisure Walking; working on deck      Observation/Other Assessments   Focus on Therapeutic Outcomes (FOTO)  to be assessed next session      Posture/Postural Control   Posture/Postural Control Postural limitations    Postural Limitations Decreased lumbar lordosis      ROM / Strength   AROM / PROM / Strength AROM;PROM;Strength      AROM   AROM Assessment Site Lumbar;Hip    Right/Left Hip Right;Left    Right Hip Extension --   Only to neutral before back compensates   Right Hip External Rotation  --   WFL   Right Hip ABduction --   ~20 deg in sidelying before hip flexors compensate   Left Hip Extension --   only to neutral   Left Hip External Rotation  --   WFL   Left Hip ABduction --   ~20 deg in sidelying before hip flexors compensate   Lumbar Flexion 50%    Lumbar Extension 75%    Lumbar - Right Side Bend 1" above knee    Lumbar - Left Side Bend 1/2" above knee    Lumbar - Right Rotation  100%    Lumbar - Left Rotation 100%      Strength   Strength Assessment Site Knee;Hip    Right/Left Hip Left;Right    Right Hip Flexion 4/5   Increased symptoms on L   Right Hip Extension 4/5   4/5 within available range   Right Hip ABduction 4/5    Left Hip Flexion 4/5   Increased symptoms in R hip   Left Hip Extension 4-/5    Left Hip ABduction 3+/5    Right/Left Knee --   Grossly 5/5     Palpation   Spinal mobility Lower lumbar spring testing grossly hypomobile    Palpation comment No tenderness noted with glute and paraspinal palpation      Special Tests    Special Tests Lumbar    Lumbar Tests Slump Test;FABER test;Prone Knee Bend Test;Straight Leg Raise      FABER test   findings Negative      Slump test   Findings Positive    Side --    Comment can feel bilat but L worse than R      Prone Knee Bend Test   Findings Negative    Comment stretch in front of quads      Straight Leg Raise   Findings Positive    Side  Left                        Objective measurements completed on examination: See above findings.       [facility] Adult PT Treatment/Exercise - 07/17/21 0001       Exercises   Exercises Lumbar      Lumbar Exercises: Stretches   Lower Trunk Rotation 3 reps;10 seconds    Pelvic Tilt 5 reps;5 seconds    Piriformis Stretch Right;Left;30 seconds;1 rep      Lumbar Exercises: Standing   Other Standing Lumbar Exercises hip abduction x10 green tband   fatigue with L LE stabilizing     Lumbar Exercises: Supine   Other Supine Lumbar Exercises clamshell green tband x10                     PT Education -  07/17/21 1046     Education Details Discussed exam findings, POC, and HEP.    Person(s) Educated Patient    Methods Explanation;Demonstration;Tactile cues;Verbal cues;Handout    Comprehension Verbalized understanding;Returned demonstration;Verbal cues required;Tactile cues required              PT Short Term Goals -  07/17/21 1225       PT SHORT TERM GOAL #1   Title Pt will be independent with initial HEP    Time 3    Period Weeks    Status New    Target Date 08/07/21      PT SHORT TERM GOAL #2   Title PT will have pt complete FOTO survey within the next 1-2 visits    Time 1    Period Weeks    Status New    Target Date 07/24/21      PT SHORT TERM GOAL #3   Title Pt will be able to hold posterior pelvic tilt for at least 30 sec to demo improving core utilization    Baseline Difficulty performing 1 pelvic tilt without compensating/pushing with feet in supine    Time 3    Period Weeks    Status New    Target Date 08/07/21               PT Long Term Goals - 07/17/21 1220       PT LONG TERM GOAL #1   Title Pt will be independent with advanced HEP    Time 6    Period Weeks    Status New    Target Date 08/28/21      PT LONG TERM GOAL #2   Title Pt will report at least 50% decrease in radicular symptoms in bilat LEs    Time 6    Period Weeks    Status New    Target Date 08/28/21      PT LONG TERM GOAL #3   Title Pt will be able to tolerate standing and walking at least 15 minutes for ADLs at home without rest break    Time 6    Period Weeks    Status New    Target Date 08/28/21      PT LONG TERM GOAL #4   Title Pt will demo improved lumbar mobility (sidebending to at least knee level, forward bending to at least 6" from floor)    Time 6    Period Weeks    Status New    Target Date 08/28/21      PT LONG TERM GOAL #5   Title Pt will have improved FOTO score to predicted level    Time 6    Period Weeks    Status New    Target Date 08/28/21                    Plan - 07/17/21 1047     Clinical Impression Statement Mr. Quentyn Kolbeck is a 70 y/o M presenting to OPPT due to complaint of chronic back pain now radiating down both legs. On assessment, he demonstrates very stiff/hypomobile hips, sacrum, and lower lumbar vertebrae with L>R hip and core weakness. Pt  presents with s/s of sciatica likely due to piriformis/glute tightness. Pt would benefit from PT to address these issues to improve his mobility, increase time in standing for ADLs and leisure activities.    Personal Factors and Comorbidities Age;Fitness;Time since onset of injury/illness/exacerbation;Past/Current Experience    Examination-Activity Limitations Bed  Mobility;Bend;Squat;Stand;Locomotion Level;Lift    Examination-Participation Restrictions Community Activity;Cleaning;Meal Prep;Yard Work;Shop    Stability/Clinical Decision Making Evolving/Moderate complexity    Clinical Decision Making Moderate    Rehab Potential Good    PT Frequency 2x / week    PT Duration 6 weeks    PT Treatment/Interventions ADLs/Self Care Home Management;Aquatic Therapy;Electrical Stimulation;Cryotherapy;Moist Heat;Iontophoresis 4mg /ml Dexamethasone;Gait training;Stair training;Functional mobility training;Therapeutic activities;Therapeutic exercise;Balance training;Neuromuscular re-education;Manual techniques;Patient/family education;Taping;Vasopneumatic Device;Spinal Manipulations;Passive range of motion;Dry needling    PT Next Visit Plan Review HEP. Manual therapy/joint mobilization for lumbar and hips as indicated. Continue to work on stretching hips and improve lumbar ROM. Progress core and hip strengthening.    PT Home Exercise Plan [email or web address]    Consulted and Agree with Plan of Care Patient             Patient will benefit from skilled therapeutic intervention in order to improve the following deficits and impairments:  Decreased range of motion, Difficulty walking, Increased fascial restricitons, Increased muscle spasms, Decreased endurance, Decreased activity tolerance, Pain, Decreased balance, Hypomobility, Impaired flexibility, Decreased mobility, Decreased strength, Postural dysfunction  Visit Diagnosis: Chronic bilateral low back pain with left-sided sciatica  Radiculopathy, lumbar  region  Abnormal posture  Difficulty in walking, not elsewhere classified  Muscle weakness (generalized)     Problem List Patient Active Problem List   Diagnosis Date Noted   Lumbar stenosis with neurogenic claudication 07/03/2021   Primary osteoarthritis of left wrist 07/03/2021   Throbbing-Left Neck 09/17/2013   Occlusion and stenosis of carotid artery without mention of cerebral infarction 08/13/2012   Changing skin lesion 05/05/2012    [facility] April May, PT, DPT 07/17/2021, 12:32 PM  [facility] [address] [place], [place], [place] Phone: [phone]   Fax:  [phone]  Name: TJAY VELAZQUEZ MRN: Lahoma Rocker Date of Birth: 1951/01/27

## 2021-07-17 NOTE — Patient Instructions (Signed)
Access Code: [email or web address] URL: [email or web address] Date: 07/17/2021 Prepared by: Vernon Prey April [name]  Exercises Supine Piriformis Stretch with Leg Straight - 1 x daily - 7 x weekly - 2 sets - 30 sec hold Prone Quadriceps Stretch with Strap - 1 x daily - 7 x weekly - 2 sets - 30 sec hold Supine Lower Trunk Rotation - 1 x daily - 7 x weekly - 2 sets - 10 reps - 5 sec hold Supine Posterior Pelvic Tilt with Pelvic Floor Contraction - 1 x daily - 7 x weekly - 2 sets - 10 reps - 5 sec hold Hip Abduction with Resistance Loop - 1 x daily - 7 x weekly - 1 sets - 10 reps Hooklying Clamshell with Resistance - 1 x daily - 7 x weekly - 2 sets - 10 reps

## 2021-07-25 ENCOUNTER — Encounter: Payer: Self-pay | Admitting: Physical Therapy

## 2021-07-25 ENCOUNTER — Ambulatory Visit: Payer: Medicare Other | Admitting: Physical Therapy

## 2021-07-25 ENCOUNTER — Other Ambulatory Visit: Payer: Self-pay

## 2021-07-25 DIAGNOSIS — R262 Difficulty in walking, not elsewhere classified: Secondary | ICD-10-CM

## 2021-07-25 DIAGNOSIS — M6281 Muscle weakness (generalized): Secondary | ICD-10-CM

## 2021-07-25 DIAGNOSIS — M5442 Lumbago with sciatica, left side: Secondary | ICD-10-CM | POA: Diagnosis not present

## 2021-07-25 DIAGNOSIS — M5416 Radiculopathy, lumbar region: Secondary | ICD-10-CM

## 2021-07-25 DIAGNOSIS — R293 Abnormal posture: Secondary | ICD-10-CM

## 2021-07-25 DIAGNOSIS — G8929 Other chronic pain: Secondary | ICD-10-CM

## 2021-07-25 NOTE — Therapy (Signed)
[facility] [address] [place], [place], [address] Phone: [phone]   Fax:  [phone]  Physical Therapy Treatment  Patient Details  Name: Roger [name] MRN: [id number] Date of Birth: Jan 22, 1951 Referring Provider (PT): Myra Rude, MD   Encounter Date: 07/25/2021   PT End of Session - 07/25/21 1017     Visit Number 2    Number of Visits 13    Date for PT Re-Evaluation 08/28/21    Authorization Type Medicare    PT Start Time 0933    PT Stop Time 1012    PT Time Calculation (min) 39 min    Activity Tolerance Patient tolerated treatment well    Behavior During Therapy [facility] for tasks assessed/performed             Past Medical History:  Diagnosis Date   Carotid artery occlusion    Hyperlipidemia    Hypertension     History reviewed. No pertinent surgical history.  There were no vitals filed for this visit.   Subjective Assessment - 07/25/21 0934     Subjective I have been doing some of my HEP but I have been cheating. I got some new knee braces with magnets in the back. The back only gives me problems when I'm outside doing my yardwork, my back brace helps a lot, I am most concerned with the pain that goes down my legs from my back.    Currently in Pain? Yes    Pain Score 5     Pain Location Leg    Pain Orientation Right    Pain Descriptors / Indicators Aching;Tingling    Pain Type Chronic pain    Pain Radiating Towards R LE more than L LE at this point    Aggravating Factors  standing too long    Pain Relieving Factors getting off of it, sitting down                               [facility] Adult PT Treatment/Exercise - 07/25/21 0001       Lumbar Exercises: Stretches   Single Knee to Chest Stretch Right;Left;5 reps    Single Knee to Chest Stretch Limitations 5 second holds    Lower Trunk Rotation 5 reps    Lower Trunk Rotation Limitations 5 second holds    Pelvic Tilt 10 reps     Other Lumbar Stretch Exercise lumbar rotation stretch 2x30 seconds B edge of mat table      Lumbar Exercises: Supine   Ab Set 10 reps;3 seconds    Clam 10 reps    Clam Limitations with core set    Bridge 10 reps    Other Supine Lumbar Exercises core set with supine marches 1x20                     PT Education - 07/25/21 1016     Education Details exercise form and purpose, DOMS and management strategies, importance of good core strength    Person(s) Educated Patient    Methods Explanation    Comprehension Verbalized understanding              PT Short Term Goals - 07/17/21 1225       PT SHORT TERM GOAL #1   Title Pt will be independent with initial HEP    Time 3    Period Weeks  Status New    Target Date 08/07/21      PT SHORT TERM GOAL #2   Title PT will have pt complete FOTO survey within the next 1-2 visits    Time 1    Period Weeks    Status New    Target Date 07/24/21      PT SHORT TERM GOAL #3   Title Pt will be able to hold posterior pelvic tilt for at least 30 sec to demo improving core utilization    Baseline Difficulty performing 1 pelvic tilt without compensating/pushing with feet in supine    Time 3    Period Weeks    Status New    Target Date 08/07/21               PT Long Term Goals - 07/17/21 1220       PT LONG TERM GOAL #1   Title Pt will be independent with advanced HEP    Time 6    Period Weeks    Status New    Target Date 08/28/21      PT LONG TERM GOAL #2   Title Pt will report at least 50% decrease in radicular symptoms in bilat LEs    Time 6    Period Weeks    Status New    Target Date 08/28/21      PT LONG TERM GOAL #3   Title Pt will be able to tolerate standing and walking at least 15 minutes for ADLs at home without rest break    Time 6    Period Weeks    Status New    Target Date 08/28/21      PT LONG TERM GOAL #4   Title Pt will demo improved lumbar mobility (sidebending to at least knee  level, forward bending to at least 6" from floor)    Time 6    Period Weeks    Status New    Target Date 08/28/21      PT LONG TERM GOAL #5   Title Pt will have improved FOTO score to predicted level    Time 6    Period Weeks    Status New    Target Date 08/28/21                   Plan - 07/25/21 1017     Clinical Impression Statement Roger [name] arrives today still concerned about the pain in his legs; reports he got some new knee braces that have helped his pain, and he continues to use back brace when working outside. Generally and grossly stiff and with soft tissue/general mobility limitations which are likely root cause of his pain. Also seems to have a low level of proprioception and internal feedback in general- will benefit from continued education and tactile cuing moving forward. Continued working on mobility and stretching as well as progression of functional exercises today. Continue POC as planned.    Personal Factors and Comorbidities Age;Fitness;Time since onset of injury/illness/exacerbation;Past/Current Experience    Examination-Activity Limitations Bed Mobility;Bend;Squat;Stand;Locomotion Level;Lift    Examination-Participation Restrictions Community Activity;Cleaning;Meal Prep;Yard Work;Shop    Stability/Clinical Decision Making Evolving/Moderate complexity    Clinical Decision Making Moderate    Rehab Potential Good    PT Frequency 2x / week    PT Duration 6 weeks    PT Treatment/Interventions ADLs/Self Care Home Management;Aquatic Therapy;Electrical Stimulation;Cryotherapy;Moist Heat;Iontophoresis 4mg /ml Dexamethasone;Gait training;Stair training;Functional mobility training;Therapeutic activities;Therapeutic exercise;Balance training;Neuromuscular re-education;Manual techniques;Patient/family education;Taping;Vasopneumatic Device;Spinal Manipulations;Passive range of  motion;Dry needling    PT Next Visit Plan Review HEP. Manual therapy/joint mobilization for  lumbar and hips as indicated. Continue to work on stretching hips and improve lumbar ROM. Progress core and hip strengthening.    PT Home Exercise Plan [email or web address]    Consulted and Agree with Plan of Care Patient             Patient will benefit from skilled therapeutic intervention in order to improve the following deficits and impairments:  Decreased range of motion, Difficulty walking, Increased fascial restricitons, Increased muscle spasms, Decreased endurance, Decreased activity tolerance, Pain, Decreased balance, Hypomobility, Impaired flexibility, Decreased mobility, Decreased strength, Postural dysfunction  Visit Diagnosis: Chronic bilateral low back pain with left-sided sciatica  Radiculopathy, lumbar region  Abnormal posture  Difficulty in walking, not elsewhere classified  Muscle weakness (generalized)     Problem List Patient Active Problem List   Diagnosis Date Noted   Lumbar stenosis with neurogenic claudication 07/03/2021   Primary osteoarthritis of left wrist 07/03/2021   Throbbing-Left Neck 09/17/2013   Occlusion and stenosis of carotid artery without mention of cerebral infarction 08/13/2012   Changing skin lesion 05/05/2012   Lerry Liner PT, DPT, PN2   Supplemental Physical Therapist [facility]    Pager [phone] Acute Rehab Office [phone]   [facility] [address]. [place], [place], [address] Phone: [phone]   Fax:  [phone]  Name: Roger [name] MRN: [id number] Date of Birth: July 16, 1951

## 2021-07-27 ENCOUNTER — Encounter: Payer: Self-pay | Admitting: Physical Therapy

## 2021-07-27 ENCOUNTER — Other Ambulatory Visit: Payer: Self-pay

## 2021-07-27 ENCOUNTER — Ambulatory Visit: Payer: Medicare Other | Admitting: Physical Therapy

## 2021-07-27 DIAGNOSIS — M5416 Radiculopathy, lumbar region: Secondary | ICD-10-CM

## 2021-07-27 DIAGNOSIS — G8929 Other chronic pain: Secondary | ICD-10-CM

## 2021-07-27 DIAGNOSIS — R262 Difficulty in walking, not elsewhere classified: Secondary | ICD-10-CM

## 2021-07-27 DIAGNOSIS — M5442 Lumbago with sciatica, left side: Secondary | ICD-10-CM | POA: Diagnosis not present

## 2021-07-27 DIAGNOSIS — R293 Abnormal posture: Secondary | ICD-10-CM

## 2021-07-27 DIAGNOSIS — M6281 Muscle weakness (generalized): Secondary | ICD-10-CM

## 2021-07-27 NOTE — Therapy (Signed)
[facility] [address] [place], [place], [address] Phone: [phone]   Fax:  [phone]  Physical Therapy Treatment  Patient Details  Name: Roger [name] MRN: [id number] Date of Birth: October 30, 1950 Referring Provider (PT): Myra Rude, MD   Encounter Date: 07/27/2021   PT End of Session - 07/27/21 1106     Visit Number 3    Number of Visits 13    Date for PT Re-Evaluation 08/28/21    Authorization Type Medicare    Progress Note Due on Visit 10    PT Start Time 1016    PT Stop Time 1100    PT Time Calculation (min) 44 min    Activity Tolerance Patient tolerated treatment well    Behavior During Therapy [facility] for tasks assessed/performed             Past Medical History:  Diagnosis Date   Carotid artery occlusion    Hyperlipidemia    Hypertension     History reviewed. No pertinent surgical history.  There were no vitals filed for this visit.   Subjective Assessment - 07/27/21 1018     Subjective I was sore after last session, you worked me hard! I've been trying to do what you showed me at home. I'm still having problems with my legs tho.    Currently in Pain? No/denies   but usually a 5/10                              [facility] Adult PT Treatment/Exercise - 07/27/21 0001       Lumbar Exercises: Stretches   Single Knee to Chest Stretch 5 reps;10 seconds    Single Knee to Chest Stretch Limitations 5 second holds    Lower Trunk Rotation 5 reps    Lower Trunk Rotation Limitations 5 second holds    Other Lumbar Stretch Exercise DKTC 5x 5 second holds    Other Lumbar Stretch Exercise lumbar rotation stretch 2x30 B; QL stretch using stool 2x30 seconds      Lumbar Exercises: Supine   Ab Set 10 reps;5 seconds    Dead Bug 20 reps    Dead Bug Limitations opposite UE/LE    Bridge 15 reps    Other Supine Lumbar Exercises TA set with supine marches 1x20                     PT  Education - 07/27/21 1106     Education Details exercise form and purpose, DOMS, HEP update    Person(s) Educated Patient    Methods Explanation    Comprehension Verbalized understanding              PT Short Term Goals - 07/17/21 1225       PT SHORT TERM GOAL #1   Title Pt will be independent with initial HEP    Time 3    Period Weeks    Status New    Target Date 08/07/21      PT SHORT TERM GOAL #2   Title PT will have pt complete FOTO survey within the next 1-2 visits    Time 1    Period Weeks    Status New    Target Date 07/24/21      PT SHORT TERM GOAL #3   Title Pt will be able to hold posterior pelvic tilt for at least 30 sec to  demo improving core utilization    Baseline Difficulty performing 1 pelvic tilt without compensating/pushing with feet in supine    Time 3    Period Weeks    Status New    Target Date 08/07/21               PT Long Term Goals - 07/17/21 1220       PT LONG TERM GOAL #1   Title Pt will be independent with advanced HEP    Time 6    Period Weeks    Status New    Target Date 08/28/21      PT LONG TERM GOAL #2   Title Pt will report at least 50% decrease in radicular symptoms in bilat LEs    Time 6    Period Weeks    Status New    Target Date 08/28/21      PT LONG TERM GOAL #3   Title Pt will be able to tolerate standing and walking at least 15 minutes for ADLs at home without rest break    Time 6    Period Weeks    Status New    Target Date 08/28/21      PT LONG TERM GOAL #4   Title Pt will demo improved lumbar mobility (sidebending to at least knee level, forward bending to at least 6" from floor)    Time 6    Period Weeks    Status New    Target Date 08/28/21      PT LONG TERM GOAL #5   Title Pt will have improved FOTO score to predicted level    Time 6    Period Weeks    Status New    Target Date 08/28/21                   Plan - 07/27/21 1107     Clinical Impression Statement Roger [name] arrives  in great spirits today, very very motivated and did great with all exercises. Continued to progress strength and mobility today. Seems to be having good relief of discomfort but does still have leg pain. Tolerated a lot more activity today and very receptive to education. Continue POC as planned- anticipate he'll do really well moving forward.    Personal Factors and Comorbidities Age;Fitness;Time since onset of injury/illness/exacerbation;Past/Current Experience    Examination-Activity Limitations Bed Mobility;Bend;Squat;Stand;Locomotion Level;Lift    Examination-Participation Restrictions Community Activity;Cleaning;Meal Prep;Yard Work;Shop    Stability/Clinical Decision Making Evolving/Moderate complexity    Clinical Decision Making Moderate    Rehab Potential Good    PT Frequency 2x / week    PT Duration 6 weeks    PT Treatment/Interventions ADLs/Self Care Home Management;Aquatic Therapy;Electrical Stimulation;Cryotherapy;Moist Heat;Iontophoresis 4mg /ml Dexamethasone;Gait training;Stair training;Functional mobility training;Therapeutic activities;Therapeutic exercise;Balance training;Neuromuscular re-education;Manual techniques;Patient/family education;Taping;Vasopneumatic Device;Spinal Manipulations;Passive range of motion;Dry needling    PT Next Visit Plan continue progressing general core/LE strength, general mobility, lumbar/hip ROM. Progress to more standing activities    PT Home Exercise Plan [id number] plus seated QL stretch and lumbar rotation stretch    Consulted and Agree with Plan of Care Patient             Patient will benefit from skilled therapeutic intervention in order to improve the following deficits and impairments:  Decreased range of motion, Difficulty walking, Increased fascial restricitons, Increased muscle spasms, Decreased endurance, Decreased activity tolerance, Pain, Decreased balance, Hypomobility, Impaired flexibility, Decreased mobility, Decreased strength,  Postural dysfunction  Visit Diagnosis: Chronic bilateral low back pain  with left-sided sciatica  Radiculopathy, lumbar region  Abnormal posture  Difficulty in walking, not elsewhere classified  Muscle weakness (generalized)     Problem List Patient Active Problem List   Diagnosis Date Noted   Lumbar stenosis with neurogenic claudication 07/03/2021   Primary osteoarthritis of left wrist 07/03/2021   Throbbing-Left Neck 09/17/2013   Occlusion and stenosis of carotid artery without mention of cerebral infarction 08/13/2012   Changing skin lesion 05/05/2012   Lerry Liner PT, DPT, PN2   Supplemental Physical Therapist [facility]    Pager [phone] Acute Rehab Office [phone]  [facility] [address] [place], [place], [address] Phone: [phone]   Fax:  [phone]  Name: Roger [name] MRN: [id number] Date of Birth: 06-Nov-1950

## 2021-07-31 ENCOUNTER — Ambulatory Visit (INDEPENDENT_AMBULATORY_CARE_PROVIDER_SITE_OTHER): Payer: Medicare Other | Admitting: Family Medicine

## 2021-07-31 ENCOUNTER — Encounter: Payer: Self-pay | Admitting: Family Medicine

## 2021-07-31 VITALS — BP 140/76 | Ht 69.0 in | Wt 201.0 lb

## 2021-07-31 DIAGNOSIS — M48062 Spinal stenosis, lumbar region with neurogenic claudication: Secondary | ICD-10-CM | POA: Diagnosis present

## 2021-07-31 NOTE — Assessment & Plan Note (Signed)
Having improvement. Physical therapy and gabapentin have helped.  - counseled on home exercise therapy and supportive care - continue gabapentin and physical therapy.  - could consider further imaging.

## 2021-07-31 NOTE — Patient Instructions (Signed)
Good to see you Please continue physical therapy  Please continue the gabapentin.   Please send me a message in MyChart with any questions or updates.  Please see me back in 6-8 weeks.   --Dr. Jordan Likes

## 2021-07-31 NOTE — Progress Notes (Signed)
  Roger [name] - 70 y.o. male MRN [id number]  Date of birth: [birth year]  SUBJECTIVE:  Including CC & ROS.  No chief complaint on file.   Roger [name] is a 70 y.o. male that is  following up for his lower extremity symptoms. Has gotten improvement with physical therapy and gabapentin. Only endorses mild symptoms.    Review of Systems See HPI   HISTORY: Past Medical, Surgical, Social, and Family History Reviewed & Updated per EMR.   Pertinent Historical Findings include:  Past Medical History:  Diagnosis Date   Carotid artery occlusion    Hyperlipidemia    Hypertension     History reviewed. No pertinent surgical history.  Family History  Problem Relation Age of Onset   Stroke Mother    Hypertension Mother    Heart disease Mother        Before age 61   Heart attack Mother    Stroke Father    Diabetes Sister    Hypertension Sister    Hyperlipidemia Sister    Hypertension Daughter    Hypertension Sister    Diabetes Sister     Social History   Socioeconomic History   Marital status: Married    Spouse name: Not on file   Number of children: Not on file   Years of education: Not on file   Highest education level: Not on file  Occupational History   Not on file  Tobacco Use   Smoking status: Never   Smokeless tobacco: Never  Substance and Sexual Activity   Alcohol use: Yes    Comment: occ beer   Drug use: No   Sexual activity: Not on file  Other Topics Concern   Not on file  Social History Narrative   Not on file   Social Determinants of Health   Financial Resource Strain: Not on file  Food Insecurity: Not on file  Transportation Needs: Not on file  Physical Activity: Not on file  Stress: Not on file  Social Connections: Not on file  Intimate Partner Violence: Not on file     PHYSICAL EXAM:  VS: BP 140/76 (BP Location: Left Arm, Patient Position: Sitting)   Ht 5\' 9"  (1.753 m)   Wt 201 lb (91.2 kg)   BMI 29.68 kg/m  Physical Exam Gen: NAD,  alert, cooperative with exam, well-appearing     ASSESSMENT & PLAN:   Lumbar stenosis with neurogenic claudication Having improvement. Physical therapy and gabapentin have helped.  - counseled on home exercise therapy and supportive care - continue gabapentin and physical therapy.  - could consider further imaging.

## 2021-08-01 ENCOUNTER — Other Ambulatory Visit: Payer: Self-pay

## 2021-08-01 ENCOUNTER — Encounter: Payer: Self-pay | Admitting: Physical Therapy

## 2021-08-01 ENCOUNTER — Ambulatory Visit: Payer: Medicare Other | Attending: Family Medicine | Admitting: Physical Therapy

## 2021-08-01 DIAGNOSIS — G8929 Other chronic pain: Secondary | ICD-10-CM | POA: Diagnosis present

## 2021-08-01 DIAGNOSIS — R293 Abnormal posture: Secondary | ICD-10-CM | POA: Diagnosis present

## 2021-08-01 DIAGNOSIS — M6281 Muscle weakness (generalized): Secondary | ICD-10-CM | POA: Insufficient documentation

## 2021-08-01 DIAGNOSIS — M5442 Lumbago with sciatica, left side: Secondary | ICD-10-CM | POA: Diagnosis present

## 2021-08-01 DIAGNOSIS — R262 Difficulty in walking, not elsewhere classified: Secondary | ICD-10-CM | POA: Insufficient documentation

## 2021-08-01 DIAGNOSIS — M5416 Radiculopathy, lumbar region: Secondary | ICD-10-CM | POA: Insufficient documentation

## 2021-08-01 NOTE — Patient Instructions (Signed)
Access Code: [email or web address] URL: [email or web address] Date: 08/01/2021 Prepared by: Oley Balm  Exercises Modified Maisie Fus Stretch - 1 x daily - 7 x weekly - 1 sets - 3 reps - 20 hold Supine Figure 4 Piriformis Stretch - 1 x daily - 7 x weekly - 3 reps - 20 hold Cat Cow - 1 x daily - 7 x weekly - 1 sets - 4 reps - 10 hold Quadruped Alternating Arm Lift - 1 x daily - 7 x weekly - 1 sets - 10 reps Quadruped Alternating Leg Extensions - 1 x daily - 7 x weekly - 1 sets - 10 reps Bird Dog - 1 x daily - 7 x weekly - 1 sets - 10 reps

## 2021-08-01 NOTE — Therapy (Signed)
[facility] [address] [place], [place], [address] Phone: [phone]   Fax:  [phone]  Physical Therapy Treatment  Patient Details  Name: Roger [name] MRN: [id number] Date of Birth: 02/16/1951 Referring Provider (PT): Myra Rude, MD   Encounter Date: 08/01/2021   PT End of Session - 08/01/21 1040     Visit Number 4    Number of Visits 13    Date for PT Re-Evaluation 08/28/21    Authorization Type Medicare    Progress Note Due on Visit 10    PT Start Time 1017    PT Stop Time 1059    PT Time Calculation (min) 42 min    Activity Tolerance Patient tolerated treatment well    Behavior During Therapy [facility] for tasks assessed/performed             Past Medical History:  Diagnosis Date   Carotid artery occlusion    Hyperlipidemia    Hypertension     History reviewed. No pertinent surgical history.  There were no vitals filed for this visit.   Subjective Assessment - 08/01/21 1020     Subjective He reports his back has not bothered him, since last treatment until he raked some leaves. He had pain in his back, but it did not radiate to his legs. He has a back brace he used.    Limitations Standing;Walking;House hold activities    How long can you sit comfortably? Sitting in high chair he can feel it going down his legs    How long can you stand comfortably? <5 minutes when washing dishes    How long can you walk comfortably? 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 No/denies    Pain Onset More than a month ago    Pain Frequency Intermittent                               [facility] Adult PT Treatment/Exercise - 08/01/21 0001       Lumbar Exercises: Stretches   Single Knee to Chest Stretch 5 reps;10 seconds    Double Knee to Chest Stretch 5 reps;20 seconds    Figure 4 Stretch 3 reps;20  seconds    Figure 4 Stretch Limitations B    Other Lumbar Stretch Exercise Supine Thomas stretch with knee flexion and knee extension 3 x 20 seconds.      Lumbar Exercises: Aerobic   Nustep L4 x 6 minutes                     PT Education - 08/01/21 1057     Education Details Updated HEP    Person(s) Educated Patient    Methods Explanation;Demonstration;Handout    Comprehension Verbalized understanding;Returned demonstration              PT Short Term Goals - 08/01/21 1023       PT SHORT TERM GOAL #1   Title Pt will be independent with initial HEP    Time 1    Period Weeks    Status On-going    Target Date 08/07/21      PT SHORT TERM GOAL #2   Title PT will have pt complete FOTO survey within the next 1-2 visits    Time 1  Period Weeks    Status New    Target Date 07/24/21      PT SHORT TERM GOAL #3   Title Pt will be able to hold posterior pelvic tilt for at least 30 sec to demo improving core utilization    Baseline Difficulty performing 1 pelvic tilt without compensating/pushing with feet in supine    Time 3    Period Weeks    Status Achieved    Target Date 08/07/21               PT Long Term Goals - 07/17/21 1220       PT LONG TERM GOAL #1   Title Pt will be independent with advanced HEP    Time 6    Period Weeks    Status New    Target Date 08/28/21      PT LONG TERM GOAL #2   Title Pt will report at least 50% decrease in radicular symptoms in bilat LEs    Time 6    Period Weeks    Status New    Target Date 08/28/21      PT LONG TERM GOAL #3   Title Pt will be able to tolerate standing and walking at least 15 minutes for ADLs at home without rest break    Time 6    Period Weeks    Status New    Target Date 08/28/21      PT LONG TERM GOAL #4   Title Pt will demo improved lumbar mobility (sidebending to at least knee level, forward bending to at least 6" from floor)    Time 6    Period Weeks    Status New    Target Date  08/28/21      PT LONG TERM GOAL #5   Title Pt will have improved FOTO score to predicted level    Time 6    Period Weeks    Status New    Target Date 08/28/21                   Plan - 08/01/21 1041     Clinical Impression Statement Patient reports his back has had decreased pain, only increased after performing yard work. continued stretching for hips and low back, followed by trunk stabilization activities.    Personal Factors and Comorbidities Age;Fitness;Time since onset of injury/illness/exacerbation;Past/Current Experience    Examination-Activity Limitations Bed Mobility;Bend;Squat;Stand;Locomotion Level;Lift    Examination-Participation Restrictions Community Activity;Cleaning;Meal Prep;Yard Work;Shop    Stability/Clinical Decision Making Evolving/Moderate complexity    Rehab Potential Good    PT Frequency 2x / week    PT Duration 6 weeks    PT Treatment/Interventions ADLs/Self Care Home Management;Aquatic Therapy;Electrical Stimulation;Cryotherapy;Moist Heat;Iontophoresis 4mg /ml Dexamethasone;Gait training;Stair training;Functional mobility training;Therapeutic activities;Therapeutic exercise;Balance training;Neuromuscular re-education;Manual techniques;Patient/family education;Taping;Vasopneumatic Device;Spinal Manipulations;Passive range of motion;Dry needling    PT Next Visit Plan continue progressing general core/LE strength, general mobility, lumbar/hip ROM. Progress to more standing activities    PT Home Exercise Plan [id number] plus seated QL stretch and lumbar rotation stretch    Consulted and Agree with Plan of Care Patient             Patient will benefit from skilled therapeutic intervention in order to improve the following deficits and impairments:  Decreased range of motion, Difficulty walking, Increased fascial restricitons, Increased muscle spasms, Decreased endurance, Decreased activity tolerance, Pain, Decreased balance, Hypomobility, Impaired  flexibility, Decreased mobility, Decreased strength, Postural dysfunction  Visit Diagnosis: Chronic bilateral low  back pain with left-sided sciatica  Radiculopathy, lumbar region  Abnormal posture  Difficulty in walking, not elsewhere classified  Muscle weakness (generalized)     Problem List Patient Active Problem List   Diagnosis Date Noted   Lumbar stenosis with neurogenic claudication 07/03/2021   Primary osteoarthritis of left wrist 07/03/2021   Throbbing-Left Neck 09/17/2013   Occlusion and stenosis of carotid artery without mention of cerebral infarction 08/13/2012   Changing skin lesion 05/05/2012    Iona Beard, DPT 08/01/2021, 11:00 AM  [facility] [address] [place], [place], [address] Phone: [phone]   Fax:  [phone]  Name: NAEEM QUILLIN MRN: [id number] Date of Birth: Feb 15, 1951

## 2021-08-03 ENCOUNTER — Ambulatory Visit: Payer: Medicare Other | Admitting: Physical Therapy

## 2021-08-03 ENCOUNTER — Other Ambulatory Visit: Payer: Self-pay

## 2021-08-03 ENCOUNTER — Encounter: Payer: Self-pay | Admitting: Physical Therapy

## 2021-08-03 DIAGNOSIS — R262 Difficulty in walking, not elsewhere classified: Secondary | ICD-10-CM

## 2021-08-03 DIAGNOSIS — M5416 Radiculopathy, lumbar region: Secondary | ICD-10-CM

## 2021-08-03 DIAGNOSIS — M5442 Lumbago with sciatica, left side: Secondary | ICD-10-CM

## 2021-08-03 DIAGNOSIS — R293 Abnormal posture: Secondary | ICD-10-CM

## 2021-08-03 DIAGNOSIS — M6281 Muscle weakness (generalized): Secondary | ICD-10-CM

## 2021-08-03 DIAGNOSIS — G8929 Other chronic pain: Secondary | ICD-10-CM

## 2021-08-03 NOTE — Therapy (Signed)
[facility] [address] [place], [place], [address] Phone: [phone]   Fax:  [phone]  Physical Therapy Treatment  Patient Details  Name: Roger [name] MRN: [id number] Date of Birth: [birth year] Referring Provider (PT): Myra Rude, MD   Encounter Date: 08/03/2021   PT End of Session - 08/03/21 1158     Visit Number 5    Number of Visits 13    Date for PT Re-Evaluation 08/28/21    Authorization Type Medicare    Progress Note Due on Visit 10    PT Start Time 1020    PT Stop Time 1059    PT Time Calculation (min) 39 min    Activity Tolerance Patient tolerated treatment well    Behavior During Therapy [facility] for tasks assessed/performed             Past Medical History:  Diagnosis Date   Carotid artery occlusion    Hyperlipidemia    Hypertension     History reviewed. No pertinent surgical history.  There were no vitals filed for this visit.   Subjective Assessment - 08/03/21 1021     Subjective I was bad, I had company over and I didn't do my exercises while they were here. Felt a little sore after last session but OK.    Currently in Pain? No/denies                               [facility] Adult PT Treatment/Exercise - 08/03/21 0001       Lumbar Exercises: Stretches   Piriformis Stretch 2 reps;30 seconds    Piriformis Stretch Limitations figure 4 seated B    Other Lumbar Stretch Exercise QL stretch with stool 3x30 sconds; lumbar rotation stretch 2x30 seconds B    Other Lumbar Stretch Exercise 3D hip excursions 1x20 all directions      Lumbar Exercises: Standing   Wall Slides 10 reps    Wall Slides Limitations mini squats at wall for form    Other Standing Lumbar Exercises step ups 1x10 B 6 inch box U UE support; also side steps 1x10 6 inch box U UE support    Other Standing Lumbar Exercises deadlift from with dowel rod heavy tactile and verbal cues                      PT Education - 08/03/21 1158     Education Details deadlift form, exercise form and purpose    Person(s) Educated Patient    Methods Explanation;Demonstration;Tactile cues;Verbal cues    Comprehension Verbalized understanding;Returned demonstration              PT Short Term Goals - 08/01/21 1023       PT SHORT TERM GOAL #1   Title Pt will be independent with initial HEP    Time 1    Period Weeks    Status On-going    Target Date 08/07/21      PT SHORT TERM GOAL #2   Title PT will have pt complete FOTO survey within the next 1-2 visits    Time 1    Period Weeks    Status New    Target Date 07/24/21      PT SHORT TERM GOAL #3   Title Pt will be able to hold posterior pelvic tilt for at least 30 sec to demo improving core utilization  Baseline Difficulty performing 1 pelvic tilt without compensating/pushing with feet in supine    Time 3    Period Weeks    Status Achieved    Target Date 08/07/21               PT Long Term Goals - 07/17/21 1220       PT LONG TERM GOAL #1   Title Pt will be independent with advanced HEP    Time 6    Period Weeks    Status New    Target Date 08/28/21      PT LONG TERM GOAL #2   Title Pt will report at least 50% decrease in radicular symptoms in bilat LEs    Time 6    Period Weeks    Status New    Target Date 08/28/21      PT LONG TERM GOAL #3   Title Pt will be able to tolerate standing and walking at least 15 minutes for ADLs at home without rest break    Time 6    Period Weeks    Status New    Target Date 08/28/21      PT LONG TERM GOAL #4   Title Pt will demo improved lumbar mobility (sidebending to at least knee level, forward bending to at least 6" from floor)    Time 6    Period Weeks    Status New    Target Date 08/28/21      PT LONG TERM GOAL #5   Title Pt will have improved FOTO score to predicted level    Time 6    Period Weeks    Status New    Target Date 08/28/21                    Plan - 08/03/21 1158     Clinical Impression Statement Roger [name] arrives today really feeling well; not having any pain, the only time he has had discomfort in his back or hips has been when he was blowing leaves the other day. Progressed mobility and strengthening based tasks as tolerated today. Really did well with more advanced activities today, doing well with therapy overall thus far- but had trouble with deadlift form, will require further practice.  Likely ready for HEP update next session.    Personal Factors and Comorbidities Age;Fitness;Time since onset of injury/illness/exacerbation;Past/Current Experience    Examination-Activity Limitations Bed Mobility;Bend;Squat;Stand;Locomotion Level;Lift    Examination-Participation Restrictions Community Activity;Cleaning;Meal Prep;Yard Work;Shop    Stability/Clinical Decision Making Evolving/Moderate complexity    Clinical Decision Making Moderate    Rehab Potential Good    PT Frequency 2x / week    PT Duration 6 weeks    PT Treatment/Interventions ADLs/Self Care Home Management;Aquatic Therapy;Electrical Stimulation;Cryotherapy;Moist Heat;Iontophoresis 4mg /ml Dexamethasone;Gait training;Stair training;Functional mobility training;Therapeutic activities;Therapeutic exercise;Balance training;Neuromuscular re-education;Manual techniques;Patient/family education;Taping;Vasopneumatic Device;Spinal Manipulations;Passive range of motion;Dry needling    PT Next Visit Plan continue progressing general core/LE strength, general mobility, lumbar/hip ROM. Progress to more standing activities    PT Home Exercise Plan [id number] plus seated QL stretch and lumbar rotation stretch    Consulted and Agree with Plan of Care Patient             Patient will benefit from skilled therapeutic intervention in order to improve the following deficits and impairments:  Decreased range of motion, Difficulty walking, Increased fascial restricitons,  Increased muscle spasms, Decreased endurance, Decreased activity tolerance, Pain, Decreased balance, Hypomobility, Impaired flexibility, Decreased mobility, Decreased strength, Postural  dysfunction  Visit Diagnosis: Chronic bilateral low back pain with left-sided sciatica  Radiculopathy, lumbar region  Abnormal posture  Difficulty in walking, not elsewhere classified  Muscle weakness (generalized)     Problem List Patient Active Problem List   Diagnosis Date Noted   Lumbar stenosis with neurogenic claudication 07/03/2021   Primary osteoarthritis of left wrist 07/03/2021   Throbbing-Left Neck 09/17/2013   Occlusion and stenosis of carotid artery without mention of cerebral infarction 08/13/2012   Changing skin lesion 05/05/2012   Lerry Liner PT, DPT, PN2   Supplemental Physical Therapist [facility]    Pager [phone] Acute Rehab Office [phone]   [facility] [place] [address] [place], [place], [address] Phone: [phone]   Fax:  [phone]  Name: Roger [name] MRN: [id number] Date of Birth: 03-16-1951

## 2021-08-08 ENCOUNTER — Other Ambulatory Visit: Payer: Self-pay

## 2021-08-08 ENCOUNTER — Encounter: Payer: Self-pay | Admitting: Physical Therapy

## 2021-08-08 ENCOUNTER — Ambulatory Visit: Payer: Medicare Other | Admitting: Physical Therapy

## 2021-08-08 DIAGNOSIS — R293 Abnormal posture: Secondary | ICD-10-CM

## 2021-08-08 DIAGNOSIS — M6281 Muscle weakness (generalized): Secondary | ICD-10-CM

## 2021-08-08 DIAGNOSIS — M5442 Lumbago with sciatica, left side: Secondary | ICD-10-CM | POA: Diagnosis not present

## 2021-08-08 DIAGNOSIS — R262 Difficulty in walking, not elsewhere classified: Secondary | ICD-10-CM

## 2021-08-08 DIAGNOSIS — G8929 Other chronic pain: Secondary | ICD-10-CM

## 2021-08-08 DIAGNOSIS — M5416 Radiculopathy, lumbar region: Secondary | ICD-10-CM

## 2021-08-08 NOTE — Therapy (Signed)
[facility] [place] [facility] [address] [place], [place], [address] Phone: [phone]   Fax:  [phone]  Physical Therapy Treatment  Patient Details  Name: Roger [name] MRN: [id number] Date of Birth: 08/17/1951 Referring Provider (PT): Myra Rude, MD   Encounter Date: 08/08/2021   PT End of Session - 08/08/21 1326     Visit Number 6    Number of Visits 13    Date for PT Re-Evaluation 08/28/21    Authorization Type Medicare    Progress Note Due on Visit 10    PT Start Time 1018    PT Stop Time 1058    PT Time Calculation (min) 40 min    Activity Tolerance Patient tolerated treatment well    Behavior During Therapy [facility] for tasks assessed/performed             Past Medical History:  Diagnosis Date   Carotid artery occlusion    Hyperlipidemia    Hypertension     History reviewed. No pertinent surgical history.  There were no vitals filed for this visit.   Subjective Assessment - 08/08/21 1023     Subjective I feel ok. Still moderately consistent with HEP. He feels like it is helping.    Limitations Standing;Walking;House hold activities    How long can you sit comfortably? Sitting in high chair he can feel it going down his legs    How long can you stand comfortably? <5 minutes when washing dishes    How long can you walk comfortably? 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 No/denies    Pain Onset More than a month ago                               [facility] Adult PT Treatment/Exercise - 08/08/21 0001       Lumbar Exercises: Stretches   Passive Hamstring Stretch Right;Left;1 rep;30 seconds    Passive Hamstring Stretch Limitations strap, 30 sec straigth, then 30 sec in abd and in add as well    Double Knee to Chest Stretch 3 reps;20 seconds    Double Knee to Chest Stretch Limitations knees over  ball    Lower Trunk Rotation 2 reps;20 seconds    Lower Trunk Rotation Limitations over ball    Hip Flexor Stretch Right;Left;2 reps;20 seconds    Other Lumbar Stretch Exercise QL stretch over ball 3x30 sconds;    Other Lumbar Stretch Exercise Calf stretch with strap, gastroc and then soleaus.      Lumbar Exercises: Aerobic   Recumbent Bike 1.5 x 5 minutes      Lumbar Exercises: Standing   Other Standing Lumbar Exercises heel raises 2 x 10 reps                     PT Education - 08/08/21 1044     Education Details HEP, additional stretching    Person(s) Educated Patient    Methods Explanation;Demonstration    Comprehension Verbalized understanding;Returned demonstration              PT Short Term Goals - 08/08/21 1330       PT SHORT TERM GOAL #1   Title Pt will be independent with initial HEP    Time 1    Period Weeks  Status On-going    Target Date 08/07/21      PT SHORT TERM GOAL #2   Title PT will have pt complete FOTO survey within the next 1-2 visits    Time 1    Period Weeks    Status New    Target Date 07/24/21      PT SHORT TERM GOAL #3   Title Pt will be able to hold posterior pelvic tilt for at least 30 sec to demo improving core utilization    Baseline Difficulty performing 1 pelvic tilt without compensating/pushing with feet in supine    Time 3    Period Weeks    Status Achieved    Target Date 08/07/21               PT Long Term Goals - 08/08/21 1349       PT LONG TERM GOAL #1   Title Pt will be independent with advanced HEP    Time 3    Period Weeks    Status On-going    Target Date 08/29/21      PT LONG TERM GOAL #2   Title Pt will report at least 50% decrease in radicular symptoms in bilat LEs    Baseline Reports improvement, but fluctuating.    Time 3    Period Weeks    Status On-going                   Plan - 08/08/21 1327     Clinical Impression Statement Patient arrived with no complaints. He  reports some relief from pain, but notes he is still inconsistent with HEP. Therapist re- iterated how important consistent performance is to overall progress. He participates well and reports that the treatment activities appear to help.    Personal Factors and Comorbidities Age;Fitness;Time since onset of injury/illness/exacerbation;Past/Current Experience    Examination-Activity Limitations Bed Mobility;Bend;Squat;Stand;Locomotion Level;Lift    Examination-Participation Restrictions Community Activity;Cleaning;Meal Prep;Yard Work;Shop    Stability/Clinical Decision Making Evolving/Moderate complexity    Rehab Potential Good    PT Frequency 2x / week    PT Duration 6 weeks    PT Treatment/Interventions ADLs/Self Care Home Management;Aquatic Therapy;Electrical Stimulation;Cryotherapy;Moist Heat;Iontophoresis 4mg /ml Dexamethasone;Gait training;Stair training;Functional mobility training;Therapeutic activities;Therapeutic exercise;Balance training;Neuromuscular re-education;Manual techniques;Patient/family education;Taping;Vasopneumatic Device;Spinal Manipulations;Passive range of motion;Dry needling    PT Next Visit Plan continue progressing general core/LE strength, general mobility, lumbar/hip ROM. Progress to more standing activities    PT Home Exercise Plan [email or web address] plus seated QL stretch and lumbar rotation stretch    Consulted and Agree with Plan of Care Patient             Patient will benefit from skilled therapeutic intervention in order to improve the following deficits and impairments:  Decreased range of motion, Difficulty walking, Increased fascial restricitons, Increased muscle spasms, Decreased endurance, Decreased activity tolerance, Pain, Decreased balance, Hypomobility, Impaired flexibility, Decreased mobility, Decreased strength, Postural dysfunction  Visit Diagnosis: Chronic bilateral low back pain with left-sided sciatica  Radiculopathy, lumbar region  Abnormal  posture  Difficulty in walking, not elsewhere classified  Muscle weakness (generalized)     Problem List Patient Active Problem List   Diagnosis Date Noted   Lumbar stenosis with neurogenic claudication 07/03/2021   Primary osteoarthritis of left wrist 07/03/2021   Throbbing-Left Neck 09/17/2013   Occlusion and stenosis of carotid artery without mention of cerebral infarction 08/13/2012   Changing skin lesion 05/05/2012    07/05/2012, DPT 08/08/2021, 1:55 PM  [place]  [facility] [place] [facility] [address] [place], [place], [address] Phone: [phone]   Fax:  [phone]  Name: Roger [name] MRN: [id number] Date of Birth: 09/21/1951

## 2021-08-08 NOTE — Patient Instructions (Signed)
Access Code: [email or web address] URL: [email or web address] Date: 08/08/2021 Prepared by: Oley Balm  Exercises Supine Piriformis Stretch with Leg Straight - 1 x daily - 7 x weekly - 2 sets - 30 sec hold Prone Quadriceps Stretch with Strap - 1 x daily - 7 x weekly - 2 sets - 30 sec hold Supine Lower Trunk Rotation - 1 x daily - 7 x weekly - 2 sets - 10 reps - 5 sec hold Supine Posterior Pelvic Tilt with Pelvic Floor Contraction - 1 x daily - 7 x weekly - 2 sets - 10 reps - 5 sec hold Hip Abduction with Resistance Loop - 1 x daily - 7 x weekly - 1 sets - 10 reps Hooklying Clamshell with Resistance - 1 x daily - 7 x weekly - 2 sets - 10 reps Supine Hamstring Stretch with Strap - 1 x daily - 7 x weekly - 1 sets - 1 reps - 30 hold Supine ITB Stretch with Strap - 1 x daily - 7 x weekly - 1 sets - 1 reps - 30 hold Seated Gastroc Stretch with Strap - 1 x daily - 7 x weekly - 1 sets - 1 reps - 30 hold Supine Bridge - 1 x daily - 7 x weekly - 3 sets - 10 reps Heel Raises with Counter Support - 1 x daily - 7 x weekly - 2 sets - 10 reps

## 2021-08-10 ENCOUNTER — Ambulatory Visit: Payer: Medicare Other | Admitting: Physical Therapy

## 2021-08-10 ENCOUNTER — Other Ambulatory Visit: Payer: Self-pay

## 2021-08-10 ENCOUNTER — Encounter: Payer: Self-pay | Admitting: Physical Therapy

## 2021-08-10 DIAGNOSIS — M5416 Radiculopathy, lumbar region: Secondary | ICD-10-CM

## 2021-08-10 DIAGNOSIS — M5442 Lumbago with sciatica, left side: Secondary | ICD-10-CM | POA: Diagnosis not present

## 2021-08-10 DIAGNOSIS — R293 Abnormal posture: Secondary | ICD-10-CM

## 2021-08-10 DIAGNOSIS — R262 Difficulty in walking, not elsewhere classified: Secondary | ICD-10-CM

## 2021-08-10 DIAGNOSIS — G8929 Other chronic pain: Secondary | ICD-10-CM

## 2021-08-10 DIAGNOSIS — M6281 Muscle weakness (generalized): Secondary | ICD-10-CM

## 2021-08-10 NOTE — Therapy (Signed)
[facility] [place] [facility] [address] [place], [place], [address] Phone: [phone]   Fax:  [phone]  Physical Therapy Treatment  Patient Details  Name: Roger [name] MRN: [id number] Date of Birth: 1950-10-07 Referring Provider (PT): Myra Rude, MD   Encounter Date: 08/10/2021   PT End of Session - 08/10/21 1108     Visit Number 7    Number of Visits 13    Date for PT Re-Evaluation 08/28/21    Authorization Type Medicare    Progress Note Due on Visit 10    PT Start Time 1018    PT Stop Time 1057    PT Time Calculation (min) 39 min    Activity Tolerance Patient tolerated treatment well    Behavior During Therapy [facility] for tasks assessed/performed             Past Medical History:  Diagnosis Date   Carotid artery occlusion    Hyperlipidemia    Hypertension     History reviewed. No pertinent surgical history.  There were no vitals filed for this visit.   Subjective Assessment - 08/10/21 1020     Subjective Felt after last time, she updated my HEP last time too. So far everything is coming along. Yesterday I used leaf blower and tried to keep it close/turn with my whole body, it helped prevent my back from hurting.    Patient Stated Goals Increase time standing and walking    Currently in Pain? No/denies                               [facility] Adult PT Treatment/Exercise - 08/10/21 0001       Lumbar Exercises: Stretches   Piriformis Stretch Right;Left;2 reps;30 seconds    Piriformis Stretch Limitations figure 4 seated      Lumbar Exercises: Standing   Other Standing Lumbar Exercises wall squats 1x15    Other Standing Lumbar Exercises 3D hip excursions 1x15 each direction; hip hikes 1x10B heavy manual cues; further practice with deadlift form 0# rod to elevated stool, Mod cues for form      Lumbar Exercises: Supine   Bridge 15 reps   x2 sets in staggered stance bridge     Lumbar Exercises:  Sidelying   Hip Abduction Both;10 reps   B     Lumbar Exercises: Prone   Other Prone Lumbar Exercises bent knee raise 1x10B      Lumbar Exercises: Quadruped   Madcat/Old Horse 10 reps    Madcat/Old Horse Limitations 3 second holds    Other Quadruped Lumbar Exercises childs pose stretch 3x30 seconds                     PT Education - 08/10/21 1108     Education Details exercise form and purpose    Person(s) Educated Patient    Methods Explanation    Comprehension Verbalized understanding              PT Short Term Goals - 08/08/21 1330       PT SHORT TERM GOAL #1   Title Pt will be independent with initial HEP    Time 1    Period Weeks    Status On-going    Target Date 08/07/21      PT SHORT TERM GOAL #2   Title PT will have pt complete FOTO survey within the next 1-2 visits  Time 1    Period Weeks    Status New    Target Date 07/24/21      PT SHORT TERM GOAL #3   Title Pt will be able to hold posterior pelvic tilt for at least 30 sec to demo improving core utilization    Baseline Difficulty performing 1 pelvic tilt without compensating/pushing with feet in supine    Time 3    Period Weeks    Status Achieved    Target Date 08/07/21               PT Long Term Goals - 08/08/21 1349       PT LONG TERM GOAL #1   Title Pt will be independent with advanced HEP    Time 3    Period Weeks    Status On-going    Target Date 08/29/21      PT LONG TERM GOAL #2   Title Pt will report at least 50% decrease in radicular symptoms in bilat LEs    Baseline Reports improvement, but fluctuating.    Time 3    Period Weeks    Status On-going                   Plan - 08/10/21 1109     Clinical Impression Statement Roger [name] arrives today in good spirits, reports he continues to feel better. Spent time focusing on hip strengthening, also introduced some work in quadruped this session as well. Very weak in hips, especially hip extensors and  abductors, and spine and hips still generally stiff. Remains very motivated to improve with therapy- has 3 sessions scheduled at this point, encouraged him to make some more as MD cert extends to late November, otherwise we will plan to re-assess on November 18.    Personal Factors and Comorbidities Age;Fitness;Time since onset of injury/illness/exacerbation;Past/Current Experience    Examination-Activity Limitations Bed Mobility;Bend;Squat;Stand;Locomotion Level;Lift    Examination-Participation Restrictions Community Activity;Cleaning;Meal Prep;Yard Work;Shop    Stability/Clinical Decision Making Evolving/Moderate complexity    Clinical Decision Making Moderate    Rehab Potential Good    PT Frequency 2x / week    PT Duration 6 weeks    PT Treatment/Interventions ADLs/Self Care Home Management;Aquatic Therapy;Electrical Stimulation;Cryotherapy;Moist Heat;Iontophoresis 4mg /ml Dexamethasone;Gait training;Stair training;Functional mobility training;Therapeutic activities;Therapeutic exercise;Balance training;Neuromuscular re-education;Manual techniques;Patient/family education;Taping;Vasopneumatic Device;Spinal Manipulations;Passive range of motion;Dry needling    PT Next Visit Plan continue progressing general core/LE strength, general mobility, lumbar/hip ROM. Progress to more standing activities. Need to reassses on 11/18 if he's  not made additional appointments    PT Home Exercise Plan [email or web address] plus seated QL stretch and lumbar rotation stretch             Patient will benefit from skilled therapeutic intervention in order to improve the following deficits and impairments:  Decreased range of motion, Difficulty walking, Increased fascial restricitons, Increased muscle spasms, Decreased endurance, Decreased activity tolerance, Pain, Decreased balance, Hypomobility, Impaired flexibility, Decreased mobility, Decreased strength, Postural dysfunction  Visit Diagnosis: Chronic bilateral low back  pain with left-sided sciatica  Abnormal posture  Muscle weakness (generalized)  Difficulty in walking, not elsewhere classified  Radiculopathy, lumbar region     Problem List Patient Active Problem List   Diagnosis Date Noted   Lumbar stenosis with neurogenic claudication 07/03/2021   Primary osteoarthritis of left wrist 07/03/2021   Throbbing-Left Neck 09/17/2013   Occlusion and stenosis 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 [place], [place], [address] Phone: [phone]   Fax:  [phone]  Name: Roger [name] MRN: [id number] Date of Birth: 14-Jul-1951

## 2021-08-15 ENCOUNTER — Ambulatory Visit: Payer: Medicare Other | Admitting: Physical Therapy

## 2021-08-15 ENCOUNTER — Encounter: Payer: Self-pay | Admitting: Physical Therapy

## 2021-08-15 ENCOUNTER — Other Ambulatory Visit: Payer: Self-pay

## 2021-08-15 DIAGNOSIS — R262 Difficulty in walking, not elsewhere classified: Secondary | ICD-10-CM

## 2021-08-15 DIAGNOSIS — M5442 Lumbago with sciatica, left side: Secondary | ICD-10-CM | POA: Diagnosis not present

## 2021-08-15 DIAGNOSIS — G8929 Other chronic pain: Secondary | ICD-10-CM

## 2021-08-15 DIAGNOSIS — R293 Abnormal posture: Secondary | ICD-10-CM

## 2021-08-15 DIAGNOSIS — M5416 Radiculopathy, lumbar region: Secondary | ICD-10-CM

## 2021-08-15 DIAGNOSIS — M6281 Muscle weakness (generalized): Secondary | ICD-10-CM

## 2021-08-15 NOTE — Patient Instructions (Signed)
Access Code: [email or web address] URL: [email or web address] Date: 08/15/2021 Prepared by: Oley Balm  Program Notes Use Red Theraband resistance for standing exercises. Quality over Quantity.    Exercises Quadruped Alternating Arm Lift - 1 x daily - 7 x weekly - 3 sets - 10 reps Quadruped Alternating Leg Extensions - 1 x daily - 7 x weekly - 3 sets - 10 reps Bird Dog - 1 x daily - 7 x weekly - 1 sets - 5 reps - 3 hold Standing Hip Extension with Counter Support - 1 x daily - 7 x weekly - 2 sets - 10 reps Standing Hip Flexion with Resistance Loop - 1 x daily - 7 x weekly - 2 sets - 10 reps Hip Abduction with Resistance Loop - 1 x daily - 7 x weekly - 2 sets - 10 reps Standing Hip Adduction with Resistance - 1 x daily - 7 x weekly - 2 sets - 10 reps

## 2021-08-15 NOTE — Therapy (Signed)
[facility] [place] [facility] [address] [place], [place], [address] Phone: [phone]   Fax:  [phone]  Physical Therapy Treatment  Patient Details  Name: Roger [name] MRN: [id number] Date of Birth: 1950/12/02 Referring Provider (PT): Myra Rude, MD   Encounter Date: 08/15/2021   PT End of Session - 08/15/21 1059     Visit Number 8    Number of Visits 13    Date for PT Re-Evaluation 08/28/21    Authorization Type Medicare    Progress Note Due on Visit 10    PT Start Time 1018    PT Stop Time 1058    PT Time Calculation (min) 40 min    Activity Tolerance Patient tolerated treatment well    Behavior During Therapy [facility] for tasks assessed/performed             Past Medical History:  Diagnosis Date   Carotid artery occlusion    Hyperlipidemia    Hypertension     History reviewed. No pertinent surgical history.  There were no vitals filed for this visit.   Subjective Assessment - 08/15/21 1020     Subjective Not feeling too bad. He is able to get through his day without pain, but is having trouble sleeping due to pain.    Currently in Pain? No/denies                               [facility] Adult PT Treatment/Exercise - 08/15/21 0001       Lumbar Exercises: Aerobic   Nustep L5 x 5 minutes      Lumbar Exercises: Standing   Other Standing Lumbar Exercises 3D hip excursions 2x10 each direction; He fatigued during second set, red T band resistance.      Lumbar Exercises: Quadruped   Madcat/Old Horse 5 reps    Madcat/Old Horse Limitations 3 second holds    Single Arm Raise Right;Left;5 reps;3 seconds    Straight Leg Raise 5 reps;3 seconds    Opposite Arm/Leg Raise Right arm/Left leg;Left arm/Right leg;5 reps;3 seconds    Other Quadruped Lumbar Exercises childs pose stretch 3x30 seconds                     PT Education - 08/15/21 1042     Education Details Posture and active core  during exercises, HEP updates.    Person(s) Educated Patient    Methods Explanation;Demonstration;Handout    Comprehension Verbalized understanding;Returned demonstration              PT Short Term Goals - 08/08/21 1330       PT SHORT TERM GOAL #1   Title Pt will be independent with initial HEP    Time 1    Period Weeks    Status On-going    Target Date 08/07/21      PT SHORT TERM GOAL #2   Title PT will have pt complete FOTO survey within the next 1-2 visits    Time 1    Period Weeks    Status New    Target Date 07/24/21      PT SHORT TERM GOAL #3   Title Pt will be able to hold posterior pelvic tilt for at least 30 sec to demo improving core utilization    Baseline Difficulty performing 1 pelvic tilt without compensating/pushing with feet in supine    Time 3  Period Weeks    Status Achieved    Target Date 08/07/21               PT Long Term Goals - 08/15/21 1058       PT LONG TERM GOAL #1   Title Pt will be independent with advanced HEP    Baseline program updated.    Time 1    Period Weeks    Status On-going      PT LONG TERM GOAL #2   Title Pt will report at least 50% decrease in radicular symptoms in bilat LEs    Baseline Reports improvement, but fluctuating.    Time 3    Period Weeks    Status On-going                   Plan - 08/15/21 1042     Clinical Impression Statement Patient reports continued improvement. Treatment activities focused on core stability and LE strength, adding to HEP. he required reminders to not push too hard and to maintain proper form in order to protect his back throughout activities. He feels he is close to wrapping up therapy.    Personal Factors and Comorbidities Age;Fitness;Time since onset of injury/illness/exacerbation;Past/Current Experience    Examination-Activity Limitations Bed Mobility;Bend;Squat;Stand;Locomotion Level;Lift    Examination-Participation Restrictions Community Activity;Cleaning;Meal  Prep;Yard Work;Shop    Stability/Clinical Decision Making Evolving/Moderate complexity    Rehab Potential Good    PT Frequency 2x / week    PT Duration 2 weeks    PT Treatment/Interventions ADLs/Self Care Home Management;Aquatic Therapy;Electrical Stimulation;Cryotherapy;Moist Heat;Iontophoresis 4mg /ml Dexamethasone;Gait training;Stair training;Functional mobility training;Therapeutic activities;Therapeutic exercise;Balance training;Neuromuscular re-education;Manual techniques;Patient/family education;Taping;Vasopneumatic Device;Spinal Manipulations;Passive range of motion;Dry needling    PT Next Visit Plan continue progressing general core/LE strength, general mobility, lumbar/hip ROM. Progress to more standing activities.    PT Home Exercise Plan [email or web address] plus seated QL stretch and lumbar rotation stretch    Consulted and Agree with Plan of Care Patient             Patient will benefit from skilled therapeutic intervention in order to improve the following deficits and impairments:  Decreased range of motion, Difficulty walking, Increased fascial restricitons, Increased muscle spasms, Decreased endurance, Decreased activity tolerance, Pain, Decreased balance, Hypomobility, Impaired flexibility, Decreased mobility, Decreased strength, Postural dysfunction  Visit Diagnosis: Chronic bilateral low back pain with left-sided sciatica  Abnormal posture  Muscle weakness (generalized)  Difficulty in walking, not elsewhere classified  Radiculopathy, lumbar region     Problem List Patient Active Problem List   Diagnosis Date Noted   Lumbar stenosis with neurogenic claudication 07/03/2021   Primary osteoarthritis of left wrist 07/03/2021   Throbbing-Left Neck 09/17/2013   Occlusion and stenosis of carotid artery without mention of cerebral infarction 08/13/2012   Changing skin lesion 05/05/2012    07/05/2012, DPT 08/15/2021, 11:00 AM  [facility] [place] [address] [place], [place], [place] Phone: [phone]   Fax:  [phone]  Name: Roger [name] MRN: Lahoma Rocker Date of Birth: [birth year]

## 2021-08-17 ENCOUNTER — Other Ambulatory Visit: Payer: Self-pay

## 2021-08-17 ENCOUNTER — Ambulatory Visit: Payer: Medicare Other | Admitting: Physical Therapy

## 2021-08-17 ENCOUNTER — Encounter: Payer: Self-pay | Admitting: Physical Therapy

## 2021-08-17 DIAGNOSIS — M6281 Muscle weakness (generalized): Secondary | ICD-10-CM

## 2021-08-17 DIAGNOSIS — M5442 Lumbago with sciatica, left side: Secondary | ICD-10-CM | POA: Diagnosis not present

## 2021-08-17 DIAGNOSIS — R293 Abnormal posture: Secondary | ICD-10-CM

## 2021-08-17 DIAGNOSIS — R262 Difficulty in walking, not elsewhere classified: Secondary | ICD-10-CM

## 2021-08-17 DIAGNOSIS — G8929 Other chronic pain: Secondary | ICD-10-CM

## 2021-08-17 DIAGNOSIS — M5416 Radiculopathy, lumbar region: Secondary | ICD-10-CM

## 2021-08-17 NOTE — Therapy (Signed)
[facility] [address] [place], [place], [address] Phone: [phone]   Fax:  [phone]  Physical Therapy Treatment  Patient Details  Name: Roger [name] MRN: [id number] Date of Birth: Oct 03, 1950 Referring Provider (PT): Myra Rude, MD   Encounter Date: 08/17/2021   PT End of Session - 08/17/21 1059     Visit Number 9    Number of Visits 13    Date for PT Re-Evaluation 08/28/21    Authorization Type Medicare    Progress Note Due on Visit 10    PT Start Time 1020    PT Stop Time 1058    PT Time Calculation (min) 38 min    Activity Tolerance Patient tolerated treatment well    Behavior During Therapy [facility] for tasks assessed/performed             Past Medical History:  Diagnosis Date   Carotid artery occlusion    Hyperlipidemia    Hypertension     History reviewed. No pertinent surgical history.  There were no vitals filed for this visit.   Subjective Assessment - 08/17/21 1023     Subjective Back was a little sore after blowing the leaves.    Limitations Standing;Walking;House hold activities    How long can you sit comfortably? Sitting in high chair he can feel it going down his legs    How long can you stand comfortably? <5 minutes when washing dishes    How long can you walk comfortably? 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 No/denies    Pain Onset More than a month ago                               [facility] Adult PT Treatment/Exercise - 08/17/21 0001       Lumbar Exercises: Stretches   Figure 4 Stretch 3 reps;20 seconds    Figure 4 Stretch Limitations B over ball.      Lumbar Exercises: Aerobic   Nustep L5 x 5 minutes      Lumbar Exercises: Standing   Other Standing Lumbar Exercises Paloff press 2 x 10 B with10# resistance    Other Standing Lumbar Exercises Dead  lifts 1 x 10 with 6# resistance      Lumbar Exercises: Supine   Bridge 10 reps    Bridge Limitations over ball    Bridge with Ball Squeeze Limitations 10 reps, over large physioball with legs at 10 and 2, wit hIR.    Other Supine Lumbar Exercises supine over physioball, roll ball sid e to side while holding the bridge, x 30 sec.                     PT Education - 08/17/21 1058     Education Details Updated HEP for calf stretch and strenghten.    Person(s) Educated Patient    Methods Explanation;Demonstration;Handout    Comprehension Verbalized understanding;Returned demonstration              PT Short Term Goals - 08/08/21 1330       PT SHORT TERM GOAL #1   Title Pt will be independent with initial HEP    Time 1    Period Weeks    Status On-going    Target Date  08/07/21      PT SHORT TERM GOAL #2   Title PT will have pt complete FOTO survey within the next 1-2 visits    Time 1    Period Weeks    Status New    Target Date 07/24/21      PT SHORT TERM GOAL #3   Title Pt will be able to hold posterior pelvic tilt for at least 30 sec to demo improving core utilization    Baseline Difficulty performing 1 pelvic tilt without compensating/pushing with feet in supine    Time 3    Period Weeks    Status Achieved    Target Date 08/07/21               PT Long Term Goals - 08/15/21 1058       PT LONG TERM GOAL #1   Title Pt will be independent with advanced HEP    Baseline program updated.    Time 1    Period Weeks    Status On-going      PT LONG TERM GOAL #2   Title Pt will report at least 50% decrease in radicular symptoms in bilat LEs    Baseline Reports improvement, but fluctuating.    Time 3    Period Weeks    Status On-going                   Plan - 08/17/21 1151     Clinical Impression Statement Patient reports slightly incresed back pain after blowing leaves. Treatment focused on core strength and stability to protect against  further pain and strain with functional activities.    Personal Factors and Comorbidities Age;Fitness;Time since onset of injury/illness/exacerbation;Past/Current Experience    Examination-Activity Limitations Bed Mobility;Bend;Squat;Stand;Locomotion Level;Lift    Examination-Participation Restrictions Community Activity;Cleaning;Meal Prep;Yard Work;Shop    Stability/Clinical Decision Making Evolving/Moderate complexity    Clinical Decision Making Moderate    Rehab Potential Good    PT Frequency 2x / week    PT Duration 2 weeks    PT Treatment/Interventions ADLs/Self Care Home Management;Aquatic Therapy;Electrical Stimulation;Cryotherapy;Moist Heat;Iontophoresis 4mg /ml Dexamethasone;Gait training;Stair training;Functional mobility training;Therapeutic activities;Therapeutic exercise;Balance training;Neuromuscular re-education;Manual techniques;Patient/family education;Taping;Vasopneumatic Device;Spinal Manipulations;Passive range of motion;Dry needling    PT Next Visit Plan continue progressing general core/LE strength, general mobility, lumbar/hip ROM. Progress to more standing activities.    PT Home Exercise Plan [email or web address] plus seated QL stretch and lumbar rotation stretch Access Code: [email or web address]  URL: [email or web address]  Date: 08/17/2021  Prepared by: 08/19/2021    Exercises  Gastroc Stretch on Wall - 1 x daily - 7 x weekly - 1 sets - 5 reps - 10 hold  Heel Raises with Counter Support - 1 x daily - 7 x weekly - 3 sets - 10 reps    Consulted and Agree with Plan of Care Patient             Patient will benefit from skilled therapeutic intervention in order to improve the following deficits and impairments:  Decreased range of motion, Difficulty walking, Increased fascial restricitons, Increased muscle spasms, Decreased endurance, Decreased activity tolerance, Pain, Decreased balance, Hypomobility, Impaired flexibility, Decreased mobility, Decreased strength, Postural  dysfunction  Visit Diagnosis: Chronic bilateral low back pain with left-sided sciatica  Abnormal posture  Muscle weakness (generalized)  Difficulty in walking, not elsewhere classified  Radiculopathy, lumbar region     Problem List Patient Active Problem List   Diagnosis Date Noted   Lumbar stenosis with neurogenic claudication 07/03/2021  Primary osteoarthritis of left wrist 07/03/2021   Throbbing-Left Neck 09/17/2013   Occlusion and stenosis of carotid artery without mention of cerebral infarction 08/13/2012   Changing skin lesion 05/05/2012    Iona Beard, DPT 08/17/2021, 12:00 PM  [facility] [address] [place], [place], [address] Phone: [phone]   Fax:  [phone]  Name: Roger [name] MRN: [id number] Date of Birth: Jun 29, 1951

## 2021-08-17 NOTE — Patient Instructions (Signed)
Access Code: [email or web address] URL: [email or web address] Date: 08/17/2021 Prepared by: Oley Balm  Exercises Gastroc Stretch on Wall - 1 x daily - 7 x weekly - 1 sets - 5 reps - 10 hold Heel Raises with Counter Support - 1 x daily - 7 x weekly - 3 sets - 10 reps

## 2021-08-20 ENCOUNTER — Other Ambulatory Visit: Payer: Self-pay | Admitting: Family Medicine

## 2021-08-20 DIAGNOSIS — M48062 Spinal stenosis, lumbar region with neurogenic claudication: Secondary | ICD-10-CM

## 2022-03-19 ENCOUNTER — Other Ambulatory Visit (HOSPITAL_BASED_OUTPATIENT_CLINIC_OR_DEPARTMENT_OTHER): Payer: Self-pay

## 2022-03-19 ENCOUNTER — Emergency Department (HOSPITAL_BASED_OUTPATIENT_CLINIC_OR_DEPARTMENT_OTHER)
Admission: EM | Admit: 2022-03-19 | Discharge: 2022-03-19 | Disposition: A | Discharge status: Home / Self Care | Payer: Medicare Other | Attending: Emergency Medicine | Admitting: Emergency Medicine

## 2022-03-19 ENCOUNTER — Other Ambulatory Visit: Payer: Self-pay

## 2022-03-19 ENCOUNTER — Encounter (HOSPITAL_BASED_OUTPATIENT_CLINIC_OR_DEPARTMENT_OTHER): Payer: Self-pay | Admitting: Urology

## 2022-03-19 DIAGNOSIS — M545 Low back pain, unspecified: Secondary | ICD-10-CM | POA: Diagnosis present

## 2022-03-19 DIAGNOSIS — M5442 Lumbago with sciatica, left side: Secondary | ICD-10-CM | POA: Insufficient documentation

## 2022-03-19 DIAGNOSIS — M5441 Lumbago with sciatica, right side: Secondary | ICD-10-CM | POA: Diagnosis not present

## 2022-03-19 MED ORDER — PREDNISONE 50 MG PO TABS: 50.0000 mg | ORAL_TABLET | Freq: Every day | ORAL | 0 refills | Status: AC

## 2022-03-19 MED ORDER — PREDNISONE 50 MG PO TABS
50.0000 mg | ORAL_TABLET | Freq: Every day | ORAL | 0 refills | Status: AC
  Filled 2022-03-19: qty 5, 5d supply, fill #0

## 2022-03-19 MED ORDER — PREDNISONE 50 MG PO TABS
50.0000 mg | ORAL_TABLET | Freq: Once | ORAL | Status: AC
  Administered 2022-03-19: 50 mg via ORAL
  Filled 2022-03-19: qty 1

## 2022-03-19 NOTE — ED Provider Notes (Signed)
[facility] EMERGENCY DEPARTMENT Provider Note   CSN: [id number] Arrival date & time: 03/19/22  1206     History  Chief Complaint  Patient presents with   Leg Pain    Roger [name] is a 71 y.o. male.  Patient is a 71 year old male presenting for back pain with bilateral sciatica.  Patient admits to lower back pain for several months with radiation down the bilateral posterior lateral legs.  Denies any history of spinal injections, fevers, bowel or urinary incontinence, sensation, or motor deficits.  States he was recommended for MRI however has not received one yet.  Patient is controlling his pain right now with Tylenol 650 mg twice a day.  Since he was originally taking it 4 times a day but has decreased his dose to prevent liver damage.  Patient does not want narcotics.  Does not have primary care physician or means of ordering MRI.  The history is provided by the patient. No language interpreter was used.  Leg Pain Associated symptoms: back pain   Associated symptoms: no fever        Home Medications Prior to Admission medications   Medication Sig Start Date End Date Taking? Authorizing Provider  predniSONE (DELTASONE) 50 MG tablet Take 1 tablet (50 mg total) by mouth daily for 5 days. 03/19/22 03/24/22 Yes Edwin Dada P, DO  predniSONE (DELTASONE) 50 MG tablet Take 1 tablet (50 mg total) by mouth daily for 5 days. 03/19/22 03/24/22 Yes Edwin Dada P, DO  amlodipine-atorvastatin (CADUET) 10-20 MG per tablet  06/22/12   [provider]  aspirin 81 MG tablet Take 81 mg by mouth daily.    [provider]  diclofenac Sodium (VOLTAREN) 1 % GEL Apply 4 g topically 4 (four) times daily. To affected joint. 07/03/21   Myra Rude, MD  gabapentin (NEURONTIN) 100 MG capsule TAKE 1 CAPSULE (100 MG TOTAL) BY MOUTH THREE TIMES DAILY. 08/20/21   Myra Rude, MD  Garlic 10 MG CAPS Take by mouth.    [provider]  losartan (COZAAR) 100 MG tablet  Take 100 mg by mouth daily.  Patient not taking: Reported on 07/17/2021 08/03/12   [provider]  Multiple Vitamin (MULTIVITAMIN) tablet Take 1 tablet by mouth daily.    [provider]  olmesartan (BENICAR) 20 MG tablet Take 20 mg by mouth daily. 09/28/20   [provider]  Vitamin D, Ergocalciferol, (DRISDOL) 1.25 MG (50000 UNIT) CAPS capsule Take 50,000 Units by mouth once a week. 10/16/20   [provider]  vitamin E 180 MG (400 UNITS) capsule Take 400 Units by mouth daily.    [provider]      Allergies    Patient has no known allergies.    Review of Systems   Review of Systems  Constitutional:  Negative for chills and fever.  HENT:  Negative for ear pain and sore throat.   Eyes:  Negative for pain and visual disturbance.  Respiratory:  Negative for cough and shortness of breath.   Cardiovascular:  Negative for chest pain and palpitations.  Gastrointestinal:  Negative for abdominal pain and vomiting.  Genitourinary:  Negative for dysuria and hematuria.  Musculoskeletal:  Positive for back pain. Negative for arthralgias and gait problem.  Skin:  Negative for color change and rash.  Neurological:  Negative for seizures and syncope.  All other systems reviewed and are negative.   Physical Exam Updated Vital Signs BP (!) 146/69   Pulse 77  Temp 98.4 F (36.9 C) (Oral)   Resp 18   Ht 5\' 9"  (1.753 m)   Wt 89.8 kg   SpO2 98%   BMI 29.24 kg/m  Physical Exam Vitals and nursing note reviewed.  Constitutional:      General: He is not in acute distress.    Appearance: He is well-developed.  HENT:     Head: Normocephalic and atraumatic.  Eyes:     Conjunctiva/sclera: Conjunctivae normal.  Cardiovascular:     Rate and Rhythm: Normal rate and regular rhythm.     Pulses:          Dorsalis pedis pulses are 2+ on the right side and 2+ on the left side.     Heart sounds: No murmur heard. Pulmonary:     Effort: Pulmonary effort is  normal. No respiratory distress.     Breath sounds: Normal breath sounds.  Abdominal:     Palpations: Abdomen is soft.     Tenderness: There is no abdominal tenderness.  Musculoskeletal:        General: No swelling.     Cervical back: Neck supple. No bony tenderness.     Thoracic back: No bony tenderness.     Lumbar back: No bony tenderness.     Right lower leg: 1+ Edema present.     Left lower leg: 1+ Edema present.  Skin:    General: Skin is warm and dry.     Capillary Refill: Capillary refill takes less than 2 seconds.  Neurological:     Mental Status: He is alert and oriented to person, place, and time.     GCS: GCS eye subscore is 4. GCS verbal subscore is 5. GCS motor subscore is 6.     Sensory: Sensation is intact. No sensory deficit.     Motor: Motor function is intact.  Psychiatric:        Mood and Affect: Mood normal.     ED Results / Procedures / Treatments   Labs (all labs ordered are listed, but only abnormal results are displayed) Labs Reviewed - No data to display  EKG None  Radiology No results found.  Procedures Procedures    Medications Ordered in ED Medications  predniSONE (DELTASONE) tablet 50 mg (50 mg Oral Given 03/19/22 1342)    ED Course/ Medical Decision Making/ A&P                           Medical Decision Making Amount and/or Complexity of Data Reviewed Radiology: ordered.  Risk Prescription drug management.   23:57 PM 71 year old male presenting for back pain with bilateral sciatica.  Patient is alert and oriented x3, no acute distress, afebrile, stable vital signs.  No midline spinal tenderness.  No concerning signs or symptoms of cauda equina syndrome.  Legs are neurovascular intact with soft compartments.  Able to ambulate without difficulty.  No unilateral swelling or evidence of DVT.  Patient presenting to ER today because he was recommended for MRI however does not have a primary care physician to order one for him.  Patient  referred to neurosurgery and MRI ordered for this week.  Patient given short burst of steroids.  Requests no narcotics at this time.  Patient recently had x-ray demonstrating no acute process.  No bony metastasis.  Patient in no distress and overall condition improved here in the ED. Detailed discussions were had with the patient regarding current findings, and need for close f/u  with PCP or on call doctor. The patient has been instructed to return immediately if the symptoms worsen in any way for re-evaluation. Patient verbalized understanding and is in agreement with current care plan. All questions answered prior to discharge.         Final Clinical Impression(s) / ED Diagnoses Final diagnoses:  Acute midline low back pain with bilateral sciatica    Rx / DC Orders ED Discharge Orders          Ordered    MR LUMBAR SPINE WO CONTRAST        03/19/22 1333    predniSONE (DELTASONE) 50 MG tablet  Daily        03/19/22 1336    predniSONE (DELTASONE) 50 MG tablet  Daily        03/19/22 1350              Franne Forts, DO 03/19/22 1350

## 2022-03-19 NOTE — ED Triage Notes (Signed)
Has disc in back pinching nerves and pain in bilateral legs x 2-3 months States pain worse in left  Doctor sent pt for MRI  Denies pain now

## 2022-03-19 NOTE — Discharge Instructions (Addendum)
I have placed an order for an MRI for you at [facility].  You should receive a phone call in the next week to schedule an appointment.  At this time please ask if they have an open MRI option available.  I have also listed phone number and address to a local neurosurgery team.  Please follow-up with neurosurgery team for further management of lower back pain with sciatica after MRI results

## 2022-03-30 ENCOUNTER — Telehealth (HOSPITAL_BASED_OUTPATIENT_CLINIC_OR_DEPARTMENT_OTHER): Payer: Self-pay

## 2022-04-06 ENCOUNTER — Ambulatory Visit (HOSPITAL_BASED_OUTPATIENT_CLINIC_OR_DEPARTMENT_OTHER)
Admission: RE | Admit: 2022-04-06 | Discharge: 2022-04-06 | Disposition: A | Discharge status: Home / Self Care | Payer: Medicare Other | Source: Ambulatory Visit | Attending: Emergency Medicine | Admitting: Emergency Medicine

## 2022-04-06 DIAGNOSIS — G8929 Other chronic pain: Secondary | ICD-10-CM | POA: Insufficient documentation

## 2022-04-06 DIAGNOSIS — M545 Low back pain, unspecified: Secondary | ICD-10-CM | POA: Insufficient documentation

## 2022-11-29 ENCOUNTER — Other Ambulatory Visit: Payer: Self-pay | Admitting: *Deleted

## 2022-11-29 DIAGNOSIS — R0989 Other specified symptoms and signs involving the circulatory and respiratory systems: Secondary | ICD-10-CM

## 2022-12-05 ENCOUNTER — Ambulatory Visit (INDEPENDENT_AMBULATORY_CARE_PROVIDER_SITE_OTHER): Payer: Medicare Other | Admitting: Physician Assistant

## 2022-12-05 ENCOUNTER — Ambulatory Visit (HOSPITAL_COMMUNITY)
Admission: RE | Admit: 2022-12-05 | Discharge: 2022-12-05 | Disposition: A | Discharge status: Home / Self Care | Payer: Medicare Other | Source: Ambulatory Visit | Attending: Vascular Surgery | Admitting: Vascular Surgery

## 2022-12-05 VITALS — BP 117/60 | HR 80 | Temp 97.9°F | Resp 20 | Ht 69.0 in | Wt 196.6 lb

## 2022-12-05 DIAGNOSIS — I6523 Occlusion and stenosis of bilateral carotid arteries: Secondary | ICD-10-CM

## 2022-12-05 DIAGNOSIS — R0989 Other specified symptoms and signs involving the circulatory and respiratory systems: Secondary | ICD-10-CM | POA: Insufficient documentation

## 2022-12-05 NOTE — Progress Notes (Signed)
Office Note   History of Present Illness   Roger [name] is a 72 y.o. (01-27-1951) male who presents for surveillance of carotid artery stenosis. He has no prior history of carotid interventions. He was last seen in January 2022, at which time he was doing well without neurological symptoms. He has no history of CVA or TIA.  Today he is doing well. He denies any sudden changes to vision, slurred speech, facial drooping, or sudden weakness of his upper or lower extremities. He has been dealing with arthritis in his lower back and sciatica, but his pain is currently managed with cortisone injections every few months.  He takes aspirin and rosuvastatin daily.  Current Outpatient Medications  Medication Sig Dispense Refill   amLODipine (NORVASC) 10 MG tablet Take 10 mg by mouth daily.     aspirin 81 MG tablet Take 81 mg by mouth daily.     Garlic 10 MG CAPS Take by mouth.     Multiple Vitamin (MULTIVITAMIN) tablet Take 1 tablet by mouth daily.     rosuvastatin (CRESTOR) 20 MG tablet Take 20 mg by mouth daily.     diclofenac Sodium (VOLTAREN) 1 % GEL Apply 4 g topically 4 (four) times daily. To affected joint. (Patient not taking: Reported on 12/05/2022) 100 g 11   gabapentin (NEURONTIN) 100 MG capsule TAKE 1 CAPSULE (100 MG TOTAL) BY MOUTH THREE TIMES DAILY. (Patient not taking: Reported on 12/05/2022) 45 capsule 1   losartan (COZAAR) 100 MG tablet Take 100 mg by mouth daily.  (Patient not taking: Reported on 07/17/2021)     olmesartan (BENICAR) 20 MG tablet Take 20 mg by mouth daily. (Patient not taking: Reported on 12/05/2022)     Vitamin D, Ergocalciferol, (DRISDOL) 1.25 MG (50000 UNIT) CAPS capsule Take 50,000 Units by mouth once a week. (Patient not taking: Reported on 12/05/2022)     vitamin E 180 MG (400 UNITS) capsule Take 400 Units by mouth daily. (Patient not taking: Reported on 12/05/2022)     No current facility-administered medications for this visit.    REVIEW OF SYSTEMS (negative  unless checked):   Cardiac:  '[]'$  Chest pain or chest pressure? '[]'$  Shortness of breath upon activity? '[]'$  Shortness of breath when lying flat? '[]'$  Irregular heart rhythm?  Vascular:  '[]'$  Pain in calf, thigh, or hip brought on by walking? '[]'$  Pain in feet at night that wakes you up from your sleep? '[]'$  Blood clot in your veins? '[]'$  Leg swelling?  Pulmonary:  '[]'$  Oxygen at home? '[]'$  Productive cough? '[]'$  Wheezing?  Neurologic:  '[]'$  Sudden weakness in arms or legs? '[]'$  Sudden numbness in arms or legs? '[]'$  Sudden onset of difficult speaking or slurred speech? '[]'$  Temporary loss of vision in one eye? '[]'$  Problems with dizziness?  Gastrointestinal:  '[]'$  Blood in stool? '[]'$  Vomited blood?  Genitourinary:  '[]'$  Burning when urinating? '[]'$  Blood in urine?  Psychiatric:  '[]'$  Major depression  Hematologic:  '[]'$  Bleeding problems? '[]'$  Problems with blood clotting?  Dermatologic:  '[]'$  Rashes or ulcers?  Constitutional:  '[]'$  Fever or chills?  Ear/Nose/Throat:  '[]'$  Change in hearing? '[]'$  Nose bleeds? '[]'$  Sore throat?  Musculoskeletal:  '[x]'$  Back pain? '[]'$  Joint pain? '[]'$  Muscle pain?   Physical Examination   Vitals:   12/05/22 1044 12/05/22 1048  BP: 121/63 117/60  Pulse: 80   Resp: 20   Temp: 97.9 F (36.6 C)   TempSrc: Temporal   SpO2: 97%   Weight: 196 lb 9.6  oz (89.2 kg)   Height: '5\' 9"'$  (1.753 m)    Body mass index is 29.03 kg/m.  General:  WDWN in NAD; vital signs documented above Gait: Not observed HENT: WNL, normocephalic Pulmonary: normal non-labored breathing  Cardiac: regular rate and rhythm, no carotid bruit Abdomen: soft, NT, no masses Skin: without rashes Vascular Exam/Pulses: palpable and equal radial pulses, 2+ Extremities: without ischemic changes, gangrene or edema Musculoskeletal: no muscle wasting or atrophy  Neurologic: A&O X 3;  No focal weakness or paresthesias are detected Psychiatric:  The pt has Normal affect.  Non-Invasive Vascular Imaging   B  Carotid Duplex (12/05/2022):  R ICA stenosis:  1-39% R VA:  patent and antegrade L ICA stenosis:  1-39% L VA:  patent and antegrade   Medical Decision Making   Roger [name] is a 72 y.o. male who presents for surveillance of carotid artery stenosis  Based on the patient's vascular studies, his carotid stenosis is unchanged bilaterally at 1-39% He denies any symptoms of CVA or TIA including sudden changes to vision, sudden weakness/numbness, slurred speech, dizziness, or facial droop He is now taking rosuvastatin daily instead of combined amlodipine-atorvastatin. He is tolerating this well. He is still taking ASA '81mg'$  He has palpable radial pulses. He has no carotid bruit on exam He can follow up with our office in 2 yrs with repeat bilateral carotid artery duplex study   Roger Serene PA-C [facility] Office: [facility] MD: Scot Dock

## 2023-01-14 ENCOUNTER — Encounter: Payer: Self-pay | Admitting: *Deleted

## 2023-02-05 ENCOUNTER — Other Ambulatory Visit (HOSPITAL_BASED_OUTPATIENT_CLINIC_OR_DEPARTMENT_OTHER): Payer: Self-pay

## 2023-02-05 DIAGNOSIS — G4733 Obstructive sleep apnea (adult) (pediatric): Secondary | ICD-10-CM

## 2023-11-05 ENCOUNTER — Ambulatory Visit (INDEPENDENT_AMBULATORY_CARE_PROVIDER_SITE_OTHER): Payer: Medicare Other | Admitting: Podiatry

## 2023-11-05 ENCOUNTER — Ambulatory Visit: Payer: Medicare Other

## 2023-11-05 DIAGNOSIS — M21619 Bunion of unspecified foot: Secondary | ICD-10-CM

## 2023-11-05 DIAGNOSIS — M79675 Pain in left toe(s): Secondary | ICD-10-CM

## 2023-11-05 DIAGNOSIS — B351 Tinea unguium: Secondary | ICD-10-CM

## 2023-11-05 DIAGNOSIS — M2041 Other hammer toe(s) (acquired), right foot: Secondary | ICD-10-CM

## 2023-11-05 DIAGNOSIS — M79672 Pain in left foot: Secondary | ICD-10-CM | POA: Diagnosis not present

## 2023-11-05 DIAGNOSIS — M79674 Pain in right toe(s): Secondary | ICD-10-CM

## 2023-11-05 NOTE — Progress Notes (Signed)
 Subjective:   Patient ID: Roger [name], male   DOB: 73 y.o.   MRN: [id number]   HPI Patient presents stating he has very thickened painful nailbeds both feet that he cannot cut structural deformity of the left first metatarsal digital deformity second left keratotic lesion with pain and has tried wider shoes padding and trimming and topical medicines.  Patient does not smoke likes to be active   Review of Systems  All other systems reviewed and are negative.       Objective:  Physical Exam Vitals and nursing note reviewed.  Constitutional:      Appearance: He is well-developed.  Pulmonary:     Effort: Pulmonary effort is normal.  Musculoskeletal:        [name]: Normal range of motion.  Skin:    [name]: Skin is warm.  Neurological:     Mental Status: He is alert.     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  All questions answered  X-rays indicate significant structural malalignment left elevated second toe with deformity noted

## 2024-06-08 ENCOUNTER — Encounter: Payer: Self-pay | Admitting: Pain Medicine

## 2024-06-08 ENCOUNTER — Other Ambulatory Visit: Payer: Self-pay | Admitting: Pain Medicine

## 2024-06-08 DIAGNOSIS — M48062 Spinal stenosis, lumbar region with neurogenic claudication: Secondary | ICD-10-CM

## 2024-07-26 ENCOUNTER — Ambulatory Visit
Admission: RE | Admit: 2024-07-26 | Discharge: 2024-07-26 | Disposition: A | Source: Ambulatory Visit | Attending: Pain Medicine | Admitting: Pain Medicine

## 2024-07-26 DIAGNOSIS — M48062 Spinal stenosis, lumbar region with neurogenic claudication: Secondary | ICD-10-CM

## 2024-11-11 ENCOUNTER — Ambulatory Visit: Admitting: Podiatry

## 2025-01-06 ENCOUNTER — Ambulatory Visit

## 2025-01-06 ENCOUNTER — Ambulatory Visit (HOSPITAL_COMMUNITY)
